# Patient Record
Sex: Female | Born: 1975
Health system: Southern US, Community
[De-identification: ages and names within clinical notes are randomized; demographics above are authoritative.]

## PROBLEM LIST (undated history)

## (undated) DIAGNOSIS — Z8489 Family history of other specified conditions: Secondary | ICD-10-CM

## (undated) DIAGNOSIS — R519 Headache, unspecified: Secondary | ICD-10-CM

## (undated) DIAGNOSIS — R091 Pleurisy: Secondary | ICD-10-CM

## (undated) DIAGNOSIS — F329 Major depressive disorder, single episode, unspecified: Secondary | ICD-10-CM

## (undated) DIAGNOSIS — R51 Headache: Secondary | ICD-10-CM

## (undated) DIAGNOSIS — F32A Depression, unspecified: Secondary | ICD-10-CM

## (undated) DIAGNOSIS — Z87442 Personal history of urinary calculi: Secondary | ICD-10-CM

## (undated) HISTORY — DX: Major depressive disorder, single episode, unspecified: F32.9

## (undated) HISTORY — DX: Depression, unspecified: F32.A

---
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
## 1998-11-30 DIAGNOSIS — Z87442 Personal history of urinary calculi: Secondary | ICD-10-CM

## 1998-11-30 HISTORY — DX: Personal history of urinary calculi: Z87.442

## 2005-08-07 ENCOUNTER — Observation Stay: Payer: Self-pay

## 2005-08-11 ENCOUNTER — Inpatient Hospital Stay: Payer: Self-pay

## 2008-01-01 HISTORY — PX: TUMOR REMOVAL: SHX12

## 2009-10-05 LAB — HM PAP SMEAR: HM Pap smear: NORMAL

## 2010-05-27 ENCOUNTER — Emergency Department (HOSPITAL_COMMUNITY): Admission: EM | Admit: 2010-05-27 | Discharge: 2010-05-27 | Payer: Self-pay | Admitting: Family Medicine

## 2010-05-27 ENCOUNTER — Emergency Department (HOSPITAL_COMMUNITY): Admission: EM | Admit: 2010-05-27 | Discharge: 2010-05-27 | Payer: Self-pay | Admitting: Emergency Medicine

## 2010-06-03 ENCOUNTER — Ambulatory Visit: Payer: Self-pay | Admitting: Family Medicine

## 2010-06-03 DIAGNOSIS — N926 Irregular menstruation, unspecified: Secondary | ICD-10-CM | POA: Insufficient documentation

## 2010-06-03 DIAGNOSIS — K219 Gastro-esophageal reflux disease without esophagitis: Secondary | ICD-10-CM | POA: Insufficient documentation

## 2010-06-03 DIAGNOSIS — D649 Anemia, unspecified: Secondary | ICD-10-CM | POA: Insufficient documentation

## 2010-06-03 DIAGNOSIS — N939 Abnormal uterine and vaginal bleeding, unspecified: Secondary | ICD-10-CM

## 2010-06-03 DIAGNOSIS — N760 Acute vaginitis: Secondary | ICD-10-CM | POA: Insufficient documentation

## 2010-06-03 LAB — CONVERTED CEMR LAB: Whiff Test: NEGATIVE

## 2010-06-04 ENCOUNTER — Telehealth: Payer: Self-pay | Admitting: Family Medicine

## 2010-06-09 ENCOUNTER — Ambulatory Visit (HOSPITAL_COMMUNITY): Admission: RE | Admit: 2010-06-09 | Discharge: 2010-06-09 | Payer: Self-pay | Admitting: Family Medicine

## 2010-07-16 ENCOUNTER — Telehealth: Payer: Self-pay | Admitting: Family Medicine

## 2010-08-11 ENCOUNTER — Telehealth (INDEPENDENT_AMBULATORY_CARE_PROVIDER_SITE_OTHER): Payer: Self-pay | Admitting: *Deleted

## 2010-08-11 ENCOUNTER — Ambulatory Visit: Payer: Self-pay | Admitting: Family Medicine

## 2010-08-11 DIAGNOSIS — M545 Low back pain, unspecified: Secondary | ICD-10-CM | POA: Insufficient documentation

## 2010-08-11 LAB — CONVERTED CEMR LAB
Bilirubin Urine: NEGATIVE
Glucose, Urine, Semiquant: NEGATIVE
Ketones, urine, test strip: NEGATIVE
Nitrite: NEGATIVE
Protein, U semiquant: NEGATIVE
Specific Gravity, Urine: 1.015
Urobilinogen, UA: 0.2
WBC Urine, dipstick: NEGATIVE

## 2010-08-12 ENCOUNTER — Encounter (INDEPENDENT_AMBULATORY_CARE_PROVIDER_SITE_OTHER): Payer: Self-pay | Admitting: *Deleted

## 2010-08-12 ENCOUNTER — Telehealth (INDEPENDENT_AMBULATORY_CARE_PROVIDER_SITE_OTHER): Payer: Self-pay | Admitting: *Deleted

## 2010-08-12 ENCOUNTER — Encounter: Payer: Self-pay | Admitting: Family Medicine

## 2010-10-29 ENCOUNTER — Ambulatory Visit: Payer: Self-pay | Admitting: Family Medicine

## 2010-10-29 DIAGNOSIS — R5381 Other malaise: Secondary | ICD-10-CM | POA: Insufficient documentation

## 2010-10-29 DIAGNOSIS — R5383 Other fatigue: Secondary | ICD-10-CM

## 2010-10-29 DIAGNOSIS — R635 Abnormal weight gain: Secondary | ICD-10-CM | POA: Insufficient documentation

## 2010-10-29 DIAGNOSIS — M79609 Pain in unspecified limb: Secondary | ICD-10-CM | POA: Insufficient documentation

## 2010-10-30 ENCOUNTER — Telehealth (INDEPENDENT_AMBULATORY_CARE_PROVIDER_SITE_OTHER): Payer: Self-pay | Admitting: *Deleted

## 2010-10-30 LAB — CONVERTED CEMR LAB
Basophils Absolute: 0 10*3/uL (ref 0.0–0.1)
Basophils Relative: 1 % (ref 0.0–3.0)
Eosinophils Absolute: 0 10*3/uL (ref 0.0–0.7)
Eosinophils Relative: 1 % (ref 0.0–5.0)
Hemoglobin: 12.3 g/dL (ref 12.0–15.0)
Lymphs Abs: 1.6 10*3/uL (ref 0.7–4.0)
MCV: 92.4 fL (ref 78.0–100.0)
Monocytes Absolute: 0.3 10*3/uL (ref 0.1–1.0)
Monocytes Relative: 11 % (ref 3.0–12.0)
Neutro Abs: 0.6 10*3/uL — ABNORMAL LOW (ref 1.4–7.7)
RBC: 3.84 M/uL — ABNORMAL LOW (ref 3.87–5.11)
TSH: 1.08 microintl units/mL (ref 0.35–5.50)
WBC: 2.5 10*3/uL — ABNORMAL LOW (ref 4.5–10.5)

## 2010-11-06 ENCOUNTER — Encounter: Payer: Self-pay | Admitting: Family Medicine

## 2010-11-11 ENCOUNTER — Ambulatory Visit: Payer: Self-pay | Admitting: Family Medicine

## 2010-11-11 DIAGNOSIS — J020 Streptococcal pharyngitis: Secondary | ICD-10-CM | POA: Insufficient documentation

## 2010-11-11 LAB — CONVERTED CEMR LAB: Rapid Strep: POSITIVE

## 2010-12-31 NOTE — Assessment & Plan Note (Signed)
Summary: pulled back//lch   Vital Signs:  Patient profile:   35 year old female Weight:      179 pounds Temp:     98.1 degrees F oral Pulse rate:   64 / minute Pulse rhythm:   regular BP sitting:   120 / 68  (left arm)  Vitals Entered By: Almeta Monas CMA [facility]) (August 11, 2010 8:18 AM) CC: c/o LBP, Back Pain   History of Present Illness:       This is a 35 year old woman who presents with Back Pain.  The symptoms began 1 week ago.  The patient denies fever, chills, weakness, loss of sensation, fecal incontinence, urinary incontinence, urinary retention, dysuria, rest pain, inability to work, and inability to care for self.  The pain is located in the left low back.  The pain began at home, gradually, after straining, and after overuse.  The pain is made worse by flexion and activity.  The pain is made better by inactivity.  No urinary symptoms.  Current Medications (verified): 1)  Aleve 220 Mg Tabs (Naproxen Sodium) .... As Needed 2)  Flexeril 10 Mg Tabs (Cyclobenzaprine Hcl) .Marland Kitchen.. 1 By Mouth Three Times A Day As Needed 3)  Vicodin Es 7.5-750 Mg Tabs (Hydrocodone-Acetaminophen) .Marland Kitchen.. 1 By Mouth Q6h As Needed  Allergies (verified): 1)  ! Pcn  Past History:  Past medical, surgical, family and social histories (including risk factors) reviewed for relevance to current acute and chronic problems.  Family History: Reviewed history and no changes required.  Social History: Reviewed history and no changes required.  Review of Systems      See HPI  Physical Exam  General:  Well-developed,well-nourished,in no acute distress; alert,appropriate and cooperative throughout examination Msk:  normal ROM, no joint tenderness, and no joint warmth.   Extremities:  No clubbing, cyanosis, edema, or deformity noted with normal full range of motion of all joints.   Neurologic:  alert & oriented X3, cranial nerves II-XII intact, strength normal in all extremities, gait normal, and DTRs  symmetrical and normal.   Psych:  Cognition and judgment appear intact. Alert and cooperative with normal attention span and concentration. No apparent delusions, illusions, hallucinations   Impression & Recommendations:  Problem # 1:  BACK PAIN, LUMBAR (ICD-724.2)  Her updated medication list for this problem includes:    Aleve 220 Mg Tabs (Naproxen sodium) .Marland Kitchen... As needed    Flexeril 10 Mg Tabs (Cyclobenzaprine hcl) .Marland Kitchen... 1 by mouth three times a day as needed    Vicodin Es 7.5-750 Mg Tabs (Hydrocodone-acetaminophen) .Marland Kitchen... 1 by mouth q6h as needed  Discussed use of moist heat or ice, modified activities, medications, and stretching/strengthening exercises. Back care instructions given. To be seen in 2 weeks if no improvement; sooner if worsening of symptoms.   Complete Medication List: 1)  Aleve 220 Mg Tabs (Naproxen sodium) .... As needed 2)  Flexeril 10 Mg Tabs (Cyclobenzaprine hcl) .Marland Kitchen.. 1 by mouth three times a day as needed 3)  Vicodin Es 7.5-750 Mg Tabs (Hydrocodone-acetaminophen) .Marland Kitchen.. 1 by mouth q6h as needed Prescriptions: VICODIN ES 7.5-750 MG TABS (HYDROCODONE-ACETAMINOPHEN) 1 by mouth q6h as needed  #30 x 0   Entered and Authorized by:   Loreen Freud DO   Signed by:   Loreen Freud DO on 08/11/2010   Method used:   Print then Give to Patient   RxID:   [id number] FLEXERIL 10 MG TABS (CYCLOBENZAPRINE HCL) 1 by mouth three times a day as needed  #  30 x 0   Entered and Authorized by:   Loreen Freud DO   Signed by:   Loreen Freud DO on 08/11/2010   Method used:   Electronically to        [facility]* (retail)       [address]       [address] [place] [address]mailing       [place], [place]  [address]       Ph: [phone]       Fax: [phone]   RxID:   [id number]   Laboratory Results   Urine Tests    Routine Urinalysis   Color: yellow Appearance: Clear Glucose: negative   (Normal Range: Negative) Bilirubin: negative   (Normal Range:  Negative) Ketone: negative   (Normal Range: Negative) Spec. Gravity: 1.015   (Normal Range: 1.003-1.035) Blood: negative   (Normal Range: Negative) pH: 5.0   (Normal Range: 5.0-8.0) Protein: negative   (Normal Range: Negative) Urobilinogen: 0.2   (Normal Range: 0-1) Nitrite: negative   (Normal Range: Negative) Leukocyte Esterace: negative   (Normal Range: Negative)

## 2010-12-31 NOTE — Assessment & Plan Note (Signed)
Summary: new to est//kn   Vital Signs:  Patient profile:   35 year old female Height:      66 inches Weight:      171 pounds BMI:     27.70 Pulse rate:   54 / minute BP sitting:   112 / 76  (left arm)  Vitals Entered By: Doristine Devoid (June 03, 2010 10:43 AM) CC: new est- noticed for past few months some irregular bleeding and f/u on gerd    History of Present Illness: 35 yo woman to establish care.  previous MD- [facility].  1) GERD- pt went to UC for abd pain, UC sent her to ER for gallbladder concerns.  ER dx'd her w/ GERD and started her on Zofran and protonix.  has GI f/u tomorrow.  sxs much improved since starting meds.  2) vaginal bleeding- mirena x4 years, some hx of irregular bleeding previously but over last year has had increased spotting- almost daily. feels bleeding has been stress related- is going through a divorce, grandmother got sick and died last week.  no pain.  small amounts of blood, typically brown in color.  denies odor, itching, burning.  husband cheated- pt reports she has been tested for STDs.  3) Depression- pt has difficulty talking about husband w/out crying.  currently divorcing.  grandmother died last week.  has not been in counseling.  'i start shaking when he calls me'.  admits to a lot of 'stress'.  + insomnia, tearfullness.  emotional eating.  denies SI/HI  Preventive Screening-Counseling & Management  Alcohol-Tobacco     Smoking Status: never  Caffeine-Diet-Exercise     Does Patient Exercise: yes     Type of exercise: goes to the [employer]     Times/week: 6      Sexual History:  currently divorcing.        Drug Use:  never.    Current Medications (verified): 1)  Mirena 20 Mcg/24hr Iud (Levonorgestrel) .... As Directed 2)  Pantoprazole Sodium 40 Mg Tbec (Pantoprazole Sodium) .... Take One Tablet Daily 3)  Ondansetron Hcl 4 Mg Tabs (Ondansetron Hcl) .... Take One Tablet Two Times A Day  Allergies (verified): 1)  ! Penicillin  Past  History:  Past Medical History: Anemia-NOS GERD hx of chicken pox  Headaches   Past Surgical History: lipoma removed from back  Past History:  Care Management: Gastroenterology:Dr. Elnoria Howard  Family History: CAD-maternal grandfather HTN-mother DM-maternal grandmother STROKE-maternal grandfather COLON CA-no BREAST CA-paternal grandmother BRAIN TUMOR-mother LIVER CA-maternal grandmotheR Family History of Alcoholism/Addiction-father deceased liver problems  Social History: divorced 3 children- 02, 05, 16.  son is youngest, 2 daughters. 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 Status:  never Does Patient Exercise:  yes Sexual History:  currently divorcing Drug Use:  never  Review of Systems      See HPI  Physical Exam  General:  Well-developed,well-nourished,in no acute distress; alert,appropriate and cooperative throughout examination Abdomen:  soft, NT/ND, +BS Genitalia:  Pelvic Exam:        External: normal female genitalia without lesions or masses        Vagina: normal without lesions or masses, + brown discharge        Cervix: normal without lesions or masses        Adnexa: normal bimanual exam without masses or fullness        Uterus: normal by palpation        Pap smear: not performed   Impression & Recommendations:  Problem #  1:  GERD (ICD-530.81) Assessment New has appt w/ GI tomorrow.  continue meds until otherwise directed by Dr Elnoria Howard. Her updated medication list for this problem includes:    Pantoprazole Sodium 40 Mg Tbec (Pantoprazole sodium) .Marland Kitchen... Take one tablet daily  Problem # 2:  ABNORMAL VAGINAL BLEEDING (ICD-626.9) Assessment: New  pt's nearly daily spotting for the last year may be stress related (due to hormonal fluctuations) but needs additional w/u.  will get pelvic US to r/u fibroids, endometrial abnormalities.  Pt expresses understanding and is in agreement w/ this plan. Her updated medication list for this problem includes:    Mirena  20 Mcg/24hr Iud (Levonorgestrel) .Marland Kitchen... As directed  Orders: Radiology Referral (Radiology)  Problem # 3:  VAGINITIS (ICD-616.10) Assessment: New  + yeast on wet prep.  start diflucan.  Orders: Wet Prep ([id number])  Complete Medication List: 1)  Mirena 20 Mcg/24hr Iud (Levonorgestrel) .... As directed 2)  Pantoprazole Sodium 40 Mg Tbec (Pantoprazole sodium) .... Take one tablet daily 3)  Ondansetron Hcl 4 Mg Tabs (Ondansetron hcl) .... Take one tablet two times a day 4)  Acyclovir Sodium 1000 Mg Solr (Acyclovir sodium) .... As needed 5)  Diflucan 150 Mg Tabs (Fluconazole) .... Once daily.  may repeat in 3 days if sxs persist  Patient Instructions: 1)  Follow up in 4-6 weeks to discuss the depression/anxiety 2)  Please call and start counseling- this will help a lot! 3)  Think about meds and let me know 4)  Take the Diflucan as directed 5)  Someone will call you with your pelvic ultrasound 6)  Please have Dr Elnoria Howard send me your GI records 7)  Call with any questions or concerns 8)  Hang in there!! Prescriptions: DIFLUCAN 150 MG TABS (FLUCONAZOLE) once daily.  may repeat in 3 days if sxs persist  #2 x 0   Entered and Authorized by:   Neena Rhymes MD   Signed by:   Neena Rhymes MD on 06/03/2010   Method used:   Electronically to        [facility]* (retail)       [address]       [address] Shipping/mailing       [place], [place]  [address]       Ph: [phone]       Fax: [phone]   RxID:   [id number]    Immunization History:  Tetanus/Td Immunization History:    Tetanus/Td:  historical (03/30/2010)   Laboratory Results    Wet Mount/KOH WBC/hpf 1-5 Bacteria/hpf 1+  Rods Clue cells/hpf none  Negative whiff Yeast/hpf few KOH Negative Trichomonas/hpf none

## 2010-12-31 NOTE — Progress Notes (Signed)
Summary: resend rx   Phone Note Refill Request Message from:  Fax from Pharmacy on August 11, 2010 1:15 PM  Refills Requested: Medication #1:  FLEXERIL 10 MG TABS 1 by mouth three times a day as needed [facility] didnt receive rx   Initial call taken by: Okey Regal Spring,  August 11, 2010 1:17 PM  Follow-up for Phone Call        [facility] pharmacy and per Shanda Bumps they rcv'd the rx.Marland KitchenMarland KitchenMarland KitchenCall ended Follow-up by: Almeta Monas CMA [facility]),  August 11, 2010 1:22 PM

## 2010-12-31 NOTE — Progress Notes (Signed)
Summary: clarify med  Phone Note Refill Request Message from:  Fax from Pharmacy on October 30, 2010 9:49 AM  Refills Requested: Medication #1:  ACYCLOVIR SODIUM 1000 MG SOLR as needed [facility] - note on fax "strength does not exist,do you mean generic Zovirax to be doubled or generic Valtrex 1 gran? Please clarify, thanks"   Initial call taken by: Okey Regal Spring,  October 30, 2010 9:56 AM  Follow-up for Phone Call        change to Valacyclovir 500mg  two times a day x3 days as needed for outbreaks.  #60, 3 refills. Follow-up by: Neena Rhymes MD,  October 30, 2010 2:44 PM    New/Updated Medications: VALTREX 500 MG TABS (VALACYCLOVIR HCL) take two times a day x3 days for prn outbreaks Prescriptions: VALTREX 500 MG TABS (VALACYCLOVIR HCL) take two times a day x3 days for prn outbreaks  #60 x 3   Entered by:   Doristine Devoid CMA   Authorized by:   Neena Rhymes MD   Signed by:   Doristine Devoid CMA on 10/30/2010   Method used:   Electronically to        [facility]* (retail)       [address]       [address] Shipping/mailing       [place], [place]  [address]       Ph: [phone]       Fax: [phone]   RxID:   [id number]   Appended Document: clarify med    Clinical Lists Changes  Medications: Changed medication from VALTREX 500 MG TABS (VALACYCLOVIR HCL) take two times a day x3 days for prn outbreaks to ACYCLOVIR SODIUM 500 MG SOLR (ACYCLOVIR SODIUM) take as needed - Signed Rx of ACYCLOVIR SODIUM 500 MG SOLR (ACYCLOVIR SODIUM) take as needed;  #60 x 3;  Signed;  Entered by: Doristine Devoid CMA;  Authorized by: Neena Rhymes MD;  Method used: Electronically to [facility]*, [address], [address] Shipping/mailing, [place], [place]  [address], Ph: [phone], Fax: [phone]    Prescriptions: ACYCLOVIR SODIUM 500 MG SOLR (ACYCLOVIR SODIUM) take as needed  #60 x 3   Entered by:   Doristine Devoid CMA  Authorized by:   Neena Rhymes MD   Signed by:   Doristine Devoid CMA on 10/30/2010   Method used:   Electronically to        [facility]* (retail)       [address]       [address] Shipping/mailing       [place], [place]  [address]       Ph: [phone]       Fax: [phone]   RxID:   [id number]    Appended Document: clarify med Prescriptions: ACYCLOVIR 400 MG TABS (ACYCLOVIR) Take 1 tab three times a day for 5 day as needed  #90 x 3   Entered by:   Jeremy Johann CMA   Authorized by:   Neena Rhymes MD   Signed by:   Jeremy Johann CMA on 10/30/2010   Method used:   Re-Faxed to ...       [facility]* (retail)       [address]       [address] Shipping/mailing       [place], [place]  [address]       Ph: [phone]  Fax: [phone]   RxID:   [id number]

## 2010-12-31 NOTE — Progress Notes (Signed)
Summary: needs work note  Phone Note Call from Patient Call back at [employer] [phone]   Caller: Patient Summary of Call: spoke w/ patient says she needs to get note for missing work today due to medication says she discuss that she made need one w/ Dr. Laury Axon due to medication being strong to her. Initial call taken by: Doristine Devoid CMA,  August 12, 2010 2:15 PM  Follow-up for Phone Call        ok to give note for today Follow-up by: Loreen Freud DO,  August 12, 2010 2:54 PM  Additional Follow-up for Phone Call Additional follow up Details #1::        done .....Marland KitchenMarland KitchenDoristine Devoid CMA  August 12, 2010 2:57 PM

## 2010-12-31 NOTE — Letter (Signed)
Summary: Out of Work  [employer] [facility] [employer]  [address] [place], [place] [address]   Phone: [phone]  Fax: [phone]    August 12, 2010   Employee:  BONNY VANLEEUWEN    To Whom It May Concern:   For Medical reasons, please excuse the above named employee from work for the following dates:  Start:   08/12/2010  End:   08/12/2010  If you need additional information, please feel free to contact our office.         Sincerely,    Loreen Freud DO

## 2010-12-31 NOTE — Progress Notes (Signed)
Summary: FYI and Anxiety Med  Phone Note Call from Patient Call back at Home Phone [phone]   Caller: Patient Reason for Call: Talk to Doctor Summary of Call: Patient saw Dr. Elnoria Howard today and he have her Nexium twice a day. He stated the same thing about her nevers as you did and agress that she needs to put on medication for this. Please advise.  Initial call taken by: Harold Barban,  June 04, 2010 2:52 PM  Follow-up for Phone Call        pt needs SSRI as maintainence med.  will start Celexa daily w/ xanax as needed for the next month as a bridge while med gets in her system. Follow-up by: Neena Rhymes MD,  June 04, 2010 3:04 PM  Additional Follow-up for Phone Call Additional follow up Details #1::        [email or web address] Barban  June 04, 2010 3:12 PM    Additional Follow-up for Phone Call Additional follow up Details #2::    Patient is aware of medication information and will come and pick up rx's. Follow-up by: Harold Barban,  June 04, 2010 3:48 PM  New/Updated Medications: CITALOPRAM HYDROBROMIDE 20 MG TABS (CITALOPRAM HYDROBROMIDE) take one tablet by mouth daily ALPRAZOLAM 0.5 MG  TABS (ALPRAZOLAM) 1 tab by mouth Q6 as needed for anxiety Prescriptions: ALPRAZOLAM 0.5 MG  TABS (ALPRAZOLAM) 1 tab by mouth Q6 as needed for anxiety  #30 x 0   Entered and Authorized by:   Neena Rhymes MD   Signed by:   Neena Rhymes MD on 06/04/2010   Method used:   Printed then faxed to ...         RxID:   [id number] CITALOPRAM HYDROBROMIDE 20 MG TABS (CITALOPRAM HYDROBROMIDE) take one tablet by mouth daily  #30 x 3   Entered and Authorized by:   Neena Rhymes MD   Signed by:   Neena Rhymes MD on 06/04/2010   Method used:   Printed then faxed to ...         RxID:   [id number]

## 2010-12-31 NOTE — Letter (Signed)
Summary: Out of Work  [facility] [facility] [employer]  [address] [place], [place] [address]   Phone: [phone]  Fax: [phone]    August 12, 2010   Employee:  TIARA MAULTSBY    To Whom It May Concern:   For Medical reasons, please excuse the above named employee from work for the following dates:  Start:   08/12/10    End:   08/12/10  If you need additional information, please feel free to contact our office.         Sincerely,    Loreen Freud, DO

## 2010-12-31 NOTE — Progress Notes (Signed)
Summary: Poison Oak  Phone Note Call from Patient Call back at [employer] [phone]   Caller: Patient Reason for Call: Acute Illness Summary of Call: Patient called in and said she has poison oak. She said at her old doctor she could call in and they would call her something in. I told her we could do a steriod cream because we don't do oral steriods without and OV. She said that would be great and if the cream didnt seem to help she would come in for an OV.   Pharmacy: [facility]  Allergy: Penicillin Initial call taken by: Harold Barban,  July 16, 2010 9:54 AM  Follow-up for Phone Call        Patient is aware.  Follow-up by: Harold Barban,  July 16, 2010 10:17 AM    New/Updated Medications: TRIAMCINOLONE ACETONIDE 0.1 % OINT (TRIAMCINOLONE ACETONIDE) apply to affected area two times a day.  disp 1 large tube Prescriptions: TRIAMCINOLONE ACETONIDE 0.1 % OINT (TRIAMCINOLONE ACETONIDE) apply to affected area two times a day.  disp 1 large tube  #1 x 3   Entered and Authorized by:   Neena Rhymes MD   Signed by:   Neena Rhymes MD on 07/16/2010   Method used:   Electronically to        [facility]* (retail)       [address]       [address] Shipping/mailing       [place], [place]  [address]       Ph: [phone]       Fax: [phone]   RxID:   [id number]

## 2011-01-01 NOTE — Assessment & Plan Note (Signed)
Summary: sore throat, white patches,CBCD-288.00/fd   Vital Signs:  Patient profile:   35 year old female Weight:      183.4 pounds BMI:     29.71 Temp:     97.5 degrees F oral BP sitting:   110 / 60  (right arm) Cuff size:   regular  Vitals Entered By: Almeta Monas CMA Duncan Dull) (November 11, 2010 8:10 AM) CC: c/o sore throat x 4 days, URI symptoms   History of Present Illness:       This is a 35 year old woman who presents with URI symptoms.  The symptoms began 4 days ago.  The patient complains of nasal congestion and sore throat, but denies clear nasal discharge, purulent nasal discharge, dry cough, productive cough, earache, and sick contacts.  The patient denies fever, low-grade fever (<100.5 degrees), fever of 100.5-103 degrees, fever of 103.1-104 degrees, fever to >104 degrees, stiff neck, dyspnea, wheezing, rash, vomiting, diarrhea, use of an antipyretic, and response to antipyretic.  The patient denies itchy watery eyes, itchy throat, sneezing, seasonal symptoms, response to antihistamine, headache, muscle aches, and severe fatigue.  Risk factors for Strep sinusitis include Strep exposure.  The patient denies the following risk factors for Strep sinusitis: unilateral facial pain, unilateral nasal discharge, poor response to decongestant, double sickening, tooth pain, tender adenopathy, and absence of cough.    Current Medications (verified): 1)  Mirena 20 Mcg/24hr Iud (Levonorgestrel) .... As Directed 2)  Nexium 40 Mg Cpdr (Esomeprazole Magnesium) .... Take One Tablet Daily 3)  Ondansetron Hcl 4 Mg Tabs (Ondansetron Hcl) .... Take One Tablet Two Times A Day 4)  Acyclovir 400 Mg Tabs (Acyclovir) .... Take 1 Tab Three Times A Day For 5 Day As Needed 5)  Diflucan 150 Mg Tabs (Fluconazole) .... Once Daily.  May Repeat in 3 Days If Sxs Persist 6)  Triamcinolone Acetonide 0.1 % Oint (Triamcinolone Acetonide) .... Apply To Affected Area Two Times A Day.  Disp 1 Large Tube 7)  Aleve 220 Mg  Tabs (Naproxen Sodium) .... As Needed 8)  Flexeril 10 Mg Tabs (Cyclobenzaprine Hcl) .Marland Kitchen.. 1 By Mouth Three Times A Day As Needed 9)  Vicodin Es 7.5-750 Mg Tabs (Hydrocodone-Acetaminophen) .Marland Kitchen.. 1 By Mouth Q6h As Needed 10)  Zithromax Z-Pak 250 Mg Tabs (Azithromycin) .... As Directed 11)  Prednisone 10 Mg Tabs (Prednisone) .... 3 By Mouth Once Daily For 3 Days Then 2 By Mouth Once Daily For 3 Days Then 1 By Mouth Once Daily For 3 Days  Allergies (verified): 1)  ! Penicillin 2)  ! Pcn  Past History:  Past Medical History: Last updated: 06/03/2010 Anemia-NOS GERD hx of chicken pox  Headaches   Past Surgical History: Last updated: 06/03/2010 lipoma removed from back  Family History: Last updated: 10/29/2010 CAD-maternal grandfather HTN-mother DM-maternal grandmother STROKE-maternal grandfather COLON CA-no BREAST CA-paternal grandmother BRAIN TUMOR-mother LIVER CA-maternal grandmotheR Family History of Alcoholism/Addiction-father deceased liver problems  Social History: Last updated: 06/03/2010 divorced 3 children- 02, 05, 06.  son is youngest, 2 daughters. 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 CAD-maternal grandfather HTN-mother DM-maternal grandmother STROKE-maternal grandfather COLON CA-no BREAST CA-paternal grandmother BRAIN TUMOR-mother LIVER CA-maternal grandmotheR Family History of Alcoholism/Addiction-father deceased liver problems  Social History: Reviewed history from 06/03/2010 and no changes required. divorced 3 children- 02, 05, 06.  son is youngest, 2 daughters. 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  Otoscopic examination reveals clear canals, tympanic membranes are intact bilaterally without bulging, retraction, inflammation or discharge. Hearing is grossly normal bilaterally. Nose:  External nasal examination shows no deformity or inflammation. Nasal mucosa are pink and moist without lesions or exudates. Mouth:  no exudates and pharyngeal erythema.   Neck:  No deformities, masses, or tenderness noted. Lungs:  Normal respiratory effort, chest expands symmetrically. Lungs are clear to auscultation, no crackles or wheezes. Heart:  normal rate and no murmur.   Cervical Nodes:  No lymphadenopathy noted Psych:  Cognition and judgment appear intact. Alert and cooperative with normal attention span and concentration. No apparent delusions, illusions, hallucinations   Impression & Recommendations:  Problem # 1:  STREPTOCOCCAL SORE THROAT (ICD-034.0)  Her updated medication list for this problem includes:    Aleve 220 Mg Tabs (Naproxen sodium) .Marland Kitchen... As needed    Zithromax Z-pak 250 Mg Tabs (Azithromycin) .Marland Kitchen... As directed  Orders: Depo- Medrol 80mg  (J1040) Admin of Therapeutic Inj  intramuscular or subcutaneous (16109) Rapid Strep (60454)  Instructed to complete antibiotics and call if not improved in 48 hours.   Complete Medication List: 1)  Mirena 20 Mcg/24hr Iud (Levonorgestrel) .... As directed 2)  Nexium 40 Mg Cpdr (Esomeprazole magnesium) .... Take one tablet daily 3)  Ondansetron Hcl 4 Mg Tabs (Ondansetron hcl) .... Take one tablet two times a day 4)  Acyclovir 400 Mg Tabs (Acyclovir) .... Take 1 tab three times a day for 5 day as needed 5)  Diflucan 150 Mg Tabs (Fluconazole) .... Once daily.  may repeat in 3 days if sxs persist 6)  Triamcinolone Acetonide 0.1 % Oint (Triamcinolone acetonide) .... Apply to affected area two times a day.  disp 1 large tube 7)  Aleve 220 Mg Tabs (Naproxen sodium) .... As needed 8)  Flexeril 10 Mg  Tabs (Cyclobenzaprine hcl) .Marland Kitchen.. 1 by mouth three times a day as needed 9)  Vicodin Es 7.5-750 Mg Tabs (Hydrocodone-acetaminophen) .Marland Kitchen.. 1 by mouth q6h as needed 10)  Zithromax Z-pak 250 Mg Tabs (Azithromycin) .... As directed 11)  Prednisone 10 Mg Tabs (Prednisone) .... 3 by mouth once daily for 3 days then 2 by mouth once daily for 3 days then 1 by mouth once daily for 3 days Prescriptions: PREDNISONE 10 MG TABS (PREDNISONE) 3 by mouth once daily for 3 days then 2 by mouth once daily for 3 days then 1 by mouth once daily for 3 days  #18 x 0   Entered and Authorized by:   Loreen Freud DO   Signed by:   Loreen Freud DO on 11/11/2010   Method used:   Print then Give to Patient   RxID:   [id number] ZITHROMAX Z-PAK 250 MG TABS (AZITHROMYCIN) as directed  #1 x 0   Entered and Authorized by:   Loreen Freud DO   Signed by:   Loreen Freud DO on 11/11/2010   Method used:   Electronically to        [facility]* (retail)       [address]       [address] Shipping/mailing       [place], [place]  [address]       Ph: [phone]       Fax: [phone]  RxID:   [id number]    Medication Administration  Injection # 1:    Medication: Depo- Medrol 80mg     Diagnosis: STREPTOCOCCAL SORE THROAT (ICD-034.0)    Route: IM    Site: RUOQ gluteus    Exp Date: 05/31/2011    Lot #: [id number]    Mfr: Pharmacia    Patient tolerated injection without complications    Given by: Almeta Monas CMA Duncan Dull) (November 11, 2010 8:51 AM)  Orders Added: 1)  Depo- Medrol 80mg  [J1040] 2)  Admin of Therapeutic Inj  intramuscular or subcutaneous [96372] 3)  Est. Patient Level III [14782] 4)  Rapid Strep [95621]    Laboratory Results    Other Tests  Rapid Strep: positive

## 2011-01-01 NOTE — Letter (Signed)
Summary: Out of Work  [facility] [employer] [facility] [employer]  [address] [place], [place] [address]   Phone: [phone]  Fax: [phone]    November 11, 2010   Employee:  KERRIN MARKMAN    To Whom It May Concern:   For Medical reasons, please excuse the above named employee from work for the following dates:  Start:   11/11/2010  End:   11/12/2010  If you need additional information, please feel free to contact our office.         Sincerely,    Loreen Freud DO

## 2011-01-01 NOTE — Assessment & Plan Note (Signed)
Summary: RTO /CBS   Vital Signs:  Patient profile:   35 year old female Height:      66 inches Weight:      181 pounds BMI:     29.32 Pulse rate:   72 / minute BP sitting:   116 / 80  (left arm)  Vitals Entered By: Doristine Devoid CMA (October 29, 2010 2:39 PM) CC: discuss mirena and check L foot    History of Present Illness: 35 yo woman here today for  1) Mirena- due to be removed June.  still having daily bleeding.  GYNLogan Bores.  pt reports any sort of emotional upset and she'll bleed.  hasn't bled this week but this is first week in 'months'.  requires pads daily.  2) weight gain- has gained 36 lbs in the last 9 months.  going to the gym regularly.  was previously 150.  eating oatmeal for breakfast, tilapia and brown rice for lunch and dinner.  no sugared beverages.  recent lab work for insurance was normal per pt report.  no documented thyroid level on record.  3) L foot pain- pain at 1st MTP joint.  noteable bunion.  unable to fit in shoes due to enlarged joint and swelling.  4) GERD- needs refill of nexium  5) fatigue- 'i'm just exhausted'.  reports getting enough sleep but never feeling rested.  has had a very stressful year (divorce)  Preventive Screening-Counseling & Management  Caffeine-Diet-Exercise     Does Patient Exercise: yes  Current Medications (verified): 1)  Mirena 20 Mcg/24hr Iud (Levonorgestrel) .... As Directed 2)  Nexium 40 Mg Cpdr (Esomeprazole Magnesium) .... Take One Tablet Daily 3)  Ondansetron Hcl 4 Mg Tabs (Ondansetron Hcl) .... Take One Tablet Two Times A Day 4)  Acyclovir Sodium 1000 Mg Solr (Acyclovir Sodium) .... As Needed 5)  Diflucan 150 Mg Tabs (Fluconazole) .... Once Daily.  May Repeat in 3 Days If Sxs Persist 6)  Triamcinolone Acetonide 0.1 % Oint (Triamcinolone Acetonide) .... Apply To Affected Area Two Times A Day.  Disp 1 Large Tube 7)  Aleve 220 Mg Tabs (Naproxen Sodium) .... As Needed 8)  Flexeril 10 Mg Tabs (Cyclobenzaprine Hcl)  .Marland Kitchen.. 1 By Mouth Three Times A Day As Needed 9)  Vicodin Es 7.5-750 Mg Tabs (Hydrocodone-Acetaminophen) .Marland Kitchen.. 1 By Mouth Q6h As Needed  Allergies (verified): 1)  ! Penicillin 2)  ! Pcn  Past History:  Past medical, surgical, family and social histories (including risk factors) reviewed, and no changes noted (except as noted below).  Past Medical History: Reviewed history from 06/03/2010 and no changes required. Anemia-NOS GERD hx of chicken pox  Headaches   Past Surgical History: Reviewed history from 06/03/2010 and no changes required. lipoma removed from back  Family History: Reviewed history from 06/03/2010 and no changes required. CAD-maternal grandfather HTN-mother DM-maternal grandmother STROKE-maternal grandfather COLON CA-no BREAST CA-paternal grandmother BRAIN TUMOR-mother LIVER CA-maternal grandmotheR Family History of Alcoholism/Addiction-father deceased liver problems  Social History: Reviewed history from 06/03/2010 and no changes required. divorced 3 children- 02, 05, 06.  son is youngest, 2 daughters. 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 No apparent alopecia or balding. Neck:  No deformities, masses, or tenderness noted. Lungs:  Normal respiratory effort, chest expands symmetrically. Lungs are clear to auscultation, no crackles or wheezes. Heart:  Normal rate and regular rhythm. S1 and S2 normal without gallop, murmur, click, rub or other extra sounds. Abdomen:  Bowel sounds positive,abdomen soft and non-tender without masses, organomegaly or hernias noted. Pulses:  +2 carotid, radial, DP Extremities:  no C/C/E large bunion on L foot at MTP joint Cervical Nodes:  No lymphadenopathy noted Psych:  Cognition and judgment  appear intact. Alert and cooperative with normal attention span and concentration. No apparent delusions, illusions, hallucinations   Impression & Recommendations:  Problem # 1:  ABNORMAL VAGINAL BLEEDING (ICD-626.9) Assessment Unchanged refer to GYN for complete evaluation and tx. Her updated medication list for this problem includes:    Mirena 20 Mcg/24hr Iud (Levonorgestrel) .Marland Kitchen... As directed  Orders: Gynecologic Referral (Gyn)  Problem # 2:  WEIGHT GAIN (ICD-783.1) Assessment: New pt reports healthy diet and regular exercise.  check TSH.  encouraged continued exercise, healthy food choices.  may be related to pt's emotional stress.  will follow Orders: Specimen Handling (04540) TLB-TSH (Thyroid Stimulating Hormone) (84443-TSH)  Problem # 3:  FATIGUE (ICD-780.79) Assessment: New need to r/o anemia and hypothyroid.  may also be due to emotional strain.  will follow. Orders: Venipuncture (98119) Specimen Handling (14782) TLB-CBC Platelet - w/Differential (85025-CBCD) TLB-TSH (Thyroid Stimulating Hormone) (84443-TSH)  Problem # 4:  FOOT PAIN, LEFT (ICD-729.5) Assessment: New large bunion of L foot.  refer to podiatry for discussion of tx. Orders: Podiatry Referral (Podiatry)  Complete Medication List: 1)  Mirena 20 Mcg/24hr Iud (Levonorgestrel) .... As directed 2)  Nexium 40 Mg Cpdr (Esomeprazole magnesium) .... Take one tablet daily 3)  Ondansetron Hcl 4 Mg Tabs (Ondansetron hcl) .... Take one tablet two times a day 4)  Acyclovir 400 Mg Tabs (Acyclovir) .... Take 1 tab three times a day for 5 day as needed 5)  Diflucan 150 Mg Tabs (Fluconazole) .... Once daily.  may repeat in 3 days if sxs persist 6)  Triamcinolone Acetonide 0.1 % Oint (Triamcinolone acetonide) .... Apply to affected area two times a day.  disp 1 large tube 7)  Aleve 220 Mg Tabs (Naproxen sodium) .... As needed 8)  Flexeril 10 Mg Tabs (Cyclobenzaprine hcl) .Marland Kitchen.. 1 by mouth three times a day as needed 9)   Vicodin Es 7.5-750 Mg Tabs (Hydrocodone-acetaminophen) .Marland Kitchen.. 1 by mouth q6h as needed  Patient Instructions: 1)  We'll call you with your podiatry and GYN appts 2)  We'll notify you of your lab results 3)  Continue to make healthy food choices and get regular exercise 4)  Continue to wear supportive, comfortable shoes 5)  Take Aleve for the foot pain 6)  Call with any questions or concerns 7)  Hang in there!!! Prescriptions: NEXIUM 40 MG CPDR (ESOMEPRAZOLE MAGNESIUM) take one tablet daily  #90 x 3   Entered and Authorized by:   Neena Rhymes MD   Signed by:   Neena Rhymes MD on 10/29/2010   Method used:   Electronically to        [facility]* (retail)       [address]       [address] Shipping/mailing       [place], [place]  [address]       Ph: [phone]       Fax: [phone]   RxID:   [id number] ACYCLOVIR SODIUM 1000 MG SOLR (ACYCLOVIR SODIUM) as needed  #60 x 6   Entered and Authorized by:   Neena Rhymes MD   Signed by:   Neena Rhymes  MD on 10/29/2010   Method used:   Electronically to        [facility]* (retail)       [address]       [address] Shipping/mailing       [place], [place]  [address]       Ph: [phone]       Fax: [phone]   RxID:   [id number]    Orders Added: 1)  Venipuncture [83151] 2)  Specimen Handling [99000] 3)  Gynecologic Referral [Gyn] 4)  Podiatry Referral [Podiatry] 5)  TLB-CBC Platelet - w/Differential [85025-CBCD] 6)  TLB-TSH (Thyroid Stimulating Hormone) [84443-TSH] 7)  Est. Patient Level IV [76160]

## 2011-01-01 NOTE — Consult Note (Signed)
Summary: [facility]   Imported By: Lanelle Bal 11/27/2010 09:33:38  _____________________________________________________________________  External Attachment:    Type:   Image     Comment:   External Document

## 2011-01-01 NOTE — Consult Note (Signed)
Summary: [facility]   Imported By: Lanelle Bal 11/25/2010 09:17:40  _____________________________________________________________________  External Attachment:    Type:   Image     Comment:   External Document

## 2011-02-15 LAB — CBC
HCT: 36.1 % (ref 36.0–46.0)
Hemoglobin: 12.5 g/dL (ref 12.0–15.0)
Platelets: 160 10*3/uL (ref 150–400)
RBC: 3.83 MIL/uL — ABNORMAL LOW (ref 3.87–5.11)

## 2011-02-15 LAB — POCT URINALYSIS DIP (DEVICE)
Bilirubin Urine: NEGATIVE
Glucose, UA: NEGATIVE mg/dL
Ketones, ur: NEGATIVE mg/dL

## 2011-02-15 LAB — DIFFERENTIAL
Eosinophils Absolute: 0 10*3/uL (ref 0.0–0.7)
Lymphs Abs: 1.3 10*3/uL (ref 0.7–4.0)
Neutro Abs: 1.6 10*3/uL — ABNORMAL LOW (ref 1.7–7.7)
Neutrophils Relative %: 50 % (ref 43–77)

## 2011-02-15 LAB — COMPREHENSIVE METABOLIC PANEL
Alkaline Phosphatase: 36 U/L — ABNORMAL LOW (ref 39–117)
BUN: 8 mg/dL (ref 6–23)
CO2: 27 mEq/L (ref 19–32)
Creatinine, Ser: 0.88 mg/dL (ref 0.4–1.2)
GFR calc Af Amer: >60 mL/min (ref >60)
Potassium: 4.4 mEq/L (ref 3.5–5.1)
Sodium: 139 mEq/L (ref 135–145)
Total Protein: 7.1 g/dL (ref 6.0–8.3)

## 2011-02-16 ENCOUNTER — Telehealth: Payer: Self-pay | Admitting: Family Medicine

## 2011-02-16 LAB — LIPASE, BLOOD: Lipase: 36 U/L (ref 11–59)

## 2011-02-26 NOTE — Progress Notes (Signed)
Summary: Wants to start Phentermine  Phone Note Call from Patient   Caller: Patient Call For: Neena Rhymes MD Summary of Call: spoke with patient and she was having some issues with weight gain and wants to know if we could  start her on Phentermine.Stated she is going to the gym 5 days a week and watching diet and she still is  not losing the weight. She stated she is actually gaining more weight and  says it is not muscle weight She has taken the phentermin in the past and it worked for her. Please advise if the patient will need and OV or if you want to fax the RX in.... c/b [phone] Initial call taken by: Almeta Monas CMA Duncan Dull),  February 16, 2011 3:52 PM  Follow-up for Phone Call        ok to start meds but needs appt in 2-3 weeks to recheck BP b/c this is a stimulant and can elevate BP and HR. Follow-up by: Neena Rhymes MD,  February 16, 2011 4:20 PM  Additional Follow-up for Phone Call Additional follow up Details #1::        Patient notified. Lucious Groves CMA  February 16, 2011 4:49 PM  Additional Follow-up by: Lucious Groves CMA,  February 16, 2011 4:49 PM    New/Updated Medications: PHENTERMINE HCL 37.5 MG CAPS (PHENTERMINE HCL) 1 tab daily Prescriptions: PHENTERMINE HCL 37.5 MG CAPS (PHENTERMINE HCL) 1 tab daily  #30 x 2   Entered and Authorized by:   Neena Rhymes MD   Signed by:   Neena Rhymes MD on 02/16/2011   Method used:   Print then Give to Patient   RxID:   [id number]

## 2011-03-26 ENCOUNTER — Inpatient Hospital Stay (INDEPENDENT_AMBULATORY_CARE_PROVIDER_SITE_OTHER)
Admission: RE | Admit: 2011-03-26 | Discharge: 2011-03-26 | Disposition: A | Discharge status: Home / Self Care | Payer: 59 | Source: Ambulatory Visit | Attending: Emergency Medicine | Admitting: Emergency Medicine

## 2011-03-26 DIAGNOSIS — J019 Acute sinusitis, unspecified: Secondary | ICD-10-CM

## 2011-05-04 ENCOUNTER — Inpatient Hospital Stay (INDEPENDENT_AMBULATORY_CARE_PROVIDER_SITE_OTHER)
Admission: RE | Admit: 2011-05-04 | Discharge: 2011-05-04 | Disposition: A | Discharge status: Home / Self Care | Payer: Self-pay | Source: Ambulatory Visit

## 2011-05-04 ENCOUNTER — Ambulatory Visit (INDEPENDENT_AMBULATORY_CARE_PROVIDER_SITE_OTHER): Payer: 59

## 2011-05-04 DIAGNOSIS — R6889 Other general symptoms and signs: Secondary | ICD-10-CM

## 2011-05-04 DIAGNOSIS — T6391XA Toxic effect of contact with unspecified venomous animal, accidental (unintentional), initial encounter: Secondary | ICD-10-CM

## 2011-05-04 LAB — DIFFERENTIAL
Basophils Absolute: 0 10*3/uL (ref 0.0–0.1)
Eosinophils Absolute: 0 10*3/uL (ref 0.0–0.7)
Lymphocytes Relative: 37 % (ref 12–46)
Monocytes Relative: 6 % (ref 3–12)

## 2011-05-04 LAB — CBC
HCT: 40.2 % (ref 36.0–46.0)
Hemoglobin: 13.9 g/dL (ref 12.0–15.0)
MCH: 31 pg (ref 26.0–34.0)
MCHC: 34.6 g/dL (ref 30.0–36.0)
MCV: 89.7 fL (ref 78.0–100.0)
RDW: 13.7 % (ref 11.5–15.5)
WBC: 3.8 10*3/uL — ABNORMAL LOW (ref 4.0–10.5)

## 2011-05-04 LAB — SEDIMENTATION RATE: Sed Rate: 4 mm/hr (ref 0–22)

## 2011-05-05 LAB — POCT URINALYSIS DIP (DEVICE)
Bilirubin Urine: NEGATIVE
Glucose, UA: NEGATIVE mg/dL
Ketones, ur: NEGATIVE mg/dL
Nitrite: NEGATIVE
Protein, ur: NEGATIVE mg/dL
Specific Gravity, Urine: <=1.005 (ref 1.005–1.030)
pH: 6 (ref 5.0–8.0)

## 2011-06-11 ENCOUNTER — Other Ambulatory Visit: Payer: Self-pay | Admitting: Family Medicine

## 2011-06-11 NOTE — Telephone Encounter (Signed)
Last refilled 02/16/11 and pt last saw you in November. Please advise.

## 2011-06-11 NOTE — Telephone Encounter (Signed)
Noted. Thanks.

## 2011-06-11 NOTE — Telephone Encounter (Signed)
No refills- this med was only to be a 12 week medication.

## 2011-07-20 ENCOUNTER — Encounter: Payer: Self-pay | Admitting: Family Medicine

## 2011-07-20 ENCOUNTER — Telehealth: Payer: Self-pay

## 2011-07-20 ENCOUNTER — Ambulatory Visit (INDEPENDENT_AMBULATORY_CARE_PROVIDER_SITE_OTHER): Payer: 59 | Admitting: Family Medicine

## 2011-07-20 DIAGNOSIS — M545 Low back pain, unspecified: Secondary | ICD-10-CM

## 2011-07-20 DIAGNOSIS — Z0000 Encounter for general adult medical examination without abnormal findings: Secondary | ICD-10-CM | POA: Insufficient documentation

## 2011-07-20 LAB — HEPATIC FUNCTION PANEL
AST: 24 U/L (ref 0–37)
Alkaline Phosphatase: 39 U/L (ref 39–117)
Bilirubin, Direct: 0.1 mg/dL (ref 0.0–0.3)
Total Bilirubin: 0.3 mg/dL (ref 0.3–1.2)

## 2011-07-20 LAB — BASIC METABOLIC PANEL
CO2: 24 mEq/L (ref 19–32)
Calcium: 9 mg/dL (ref 8.4–10.5)
GFR: 82.87 mL/min (ref >60.00)
Glucose, Bld: 89 mg/dL (ref 70–99)
Potassium: 4.4 mEq/L (ref 3.5–5.1)

## 2011-07-20 LAB — CBC WITH DIFFERENTIAL/PLATELET
HCT: 34.2 % — ABNORMAL LOW (ref 36.0–46.0)
Hemoglobin: 11.6 g/dL — ABNORMAL LOW (ref 12.0–15.0)
MCHC: 33.8 g/dL (ref 30.0–36.0)
MCV: 94.8 fl (ref 78.0–100.0)
Monocytes Relative: 6.7 % (ref 3.0–12.0)
Neutro Abs: 1.5 10*3/uL (ref 1.4–7.7)
Platelets: 174 10*3/uL (ref 150.0–400.0)
RDW: 13.7 % (ref 11.5–14.6)

## 2011-07-20 LAB — LIPID PANEL
Cholesterol: 141 mg/dL (ref 0–200)
LDL Cholesterol: 71 mg/dL (ref 0–99)

## 2011-07-20 MED ORDER — NAPROXEN 500 MG PO TABS: 500.0000 mg | ORAL_TABLET | Freq: Two times a day (BID) | ORAL | Status: DC

## 2011-07-20 NOTE — Assessment & Plan Note (Signed)
Pt's PE WNL.  UTD on health maintenance.  Check labs.  Anticipatory guidance provided.  

## 2011-07-20 NOTE — Assessment & Plan Note (Signed)
Pt's pain seems to be centered over her SI joints.  Start scheduled NSAIDs and heating pad.  If no improvement in 2 weeks pt to call and we will pursue additional w/u.  Pt expressed understanding and is in agreement w/ plan.

## 2011-07-20 NOTE — Progress Notes (Signed)
  Subjective:    Patient ID: Lisa Hendrix, female    DOB: December 21, 1975, 35 y.o.   MRN: [id number]  HPI CPE- UTD on pap, too young for mammo and colonoscopy.  LBP- pain started 2-3 months ago, pain is worse w/ forward flexion.  Pain is located below area where soft tissue mass was removed.  Nothing relieves pain.   Review of Systems Patient reports no vision/ hearing changes, adenopathy,fever, weight change,  persistant/recurrent hoarseness , swallowing issues, chest pain, palpitations, edema, persistant/recurrent cough, hemoptysis, dyspnea (rest/exertional/paroxysmal nocturnal), gastrointestinal bleeding (melena, rectal bleeding), abdominal pain, significant heartburn, bowel changes, GU symptoms (dysuria, hematuria, incontinence), Gyn symptoms (abnormal  bleeding, pain),  syncope, focal weakness, memory loss, numbness & tingling, skin/hair/nail changes, abnormal bruising or bleeding, anxiety, or depression.     Objective:   Physical Exam  General Appearance:    Alert, cooperative, no distress, appears stated age  Head:    Normocephalic, without obvious abnormality, atraumatic  Eyes:    PERRL, conjunctiva/corneas clear, EOM's intact, fundi    benign, both eyes  Ears:    Normal TM's and external ear canals, both ears  Nose:   Nares normal, septum midline, mucosa normal, no drainage    or sinus tenderness  Throat:   Lips, mucosa, and tongue normal; teeth and gums normal  Neck:   Supple, symmetrical, trachea midline, no adenopathy;    Thyroid: no enlargement/tenderness/nodules  Back:     Symmetric, no curvature, ROM normal, no CVA tenderness  Lungs:     Clear to auscultation bilaterally, respirations unlabored  Chest Wall:    No tenderness or deformity   Heart:    Regular rate and rhythm, S1 and S2 normal, no murmur, rub   or gallop  Breast Exam:    Deferred to GYN  Abdomen:     Soft, non-tender, bowel sounds active all four quadrants,    no masses, no organomegaly  Genitalia:     Deferred to GYN  Rectal:    Extremities:   Extremities normal, atraumatic, no cyanosis or edema   BACK: TTP over SI joints bilaterally, pain w/ forward flexion, no pain w/ extension, (-) SLR bilaterally  Pulses:   2+ and symmetric all extremities  Skin:   Skin color, texture, turgor normal, no rashes or lesions  Lymph nodes:   Cervical, supraclavicular, and axillary nodes normal  Neurologic:   CNII-XII intact, normal strength, sensation and reflexes    throughout          Assessment & Plan:

## 2011-07-20 NOTE — Telephone Encounter (Signed)
Message copied by Beverely Low on Mon Jul 20, 2011  2:26 PM ------      Message from: Sheliah Hatch      Created: Mon Jul 20, 2011  2:13 PM       WBC is mildly low but stable when compared to other labs.  Mildly anemic but this is normal in menstruating women.  Remainder of labs look great!

## 2011-07-20 NOTE — Telephone Encounter (Signed)
Left message to call back  

## 2011-07-20 NOTE — Telephone Encounter (Signed)
Pt aware. Pt requesting refill of phenermine.  Refill denied, per Dr. Beverely Low. Pt had 3 month supply previously.

## 2011-07-20 NOTE — Patient Instructions (Signed)
Follow up in 1 year or as needed Start the Naprosyn twice daily w/ food for 7-10 days and then as needed for pain Use a heating pad for pain relief We'll notify you of your lab results Call with any questions or concerns Your exam looks great! Good luck next week!

## 2011-07-20 NOTE — Telephone Encounter (Signed)
Message copied by Beverely Low on Mon Jul 20, 2011  2:37 PM ------      Message from: Sheliah Hatch      Created: Mon Jul 20, 2011  2:13 PM       WBC is mildly low but stable when compared to other labs.  Mildly anemic but this is normal in menstruating women.  Remainder of labs look great!

## 2011-07-22 ENCOUNTER — Telehealth: Payer: Self-pay

## 2011-07-22 LAB — VITAMIN D 1,25 DIHYDROXY: Vitamin D 1, 25 (OH)2 Total: 36 pg/mL (ref 18–72)

## 2011-07-22 NOTE — Telephone Encounter (Signed)
Pt.notified

## 2011-07-22 NOTE — Telephone Encounter (Signed)
Message copied by Beverely Low on Wed Jul 22, 2011  2:40 PM ------      Message from: Sheliah Hatch      Created: Wed Jul 22, 2011  2:10 PM       Vit D is normal

## 2011-08-24 ENCOUNTER — Telehealth: Payer: Self-pay | Admitting: Family Medicine

## 2011-08-24 DIAGNOSIS — M549 Dorsalgia, unspecified: Secondary | ICD-10-CM

## 2011-08-24 NOTE — Telephone Encounter (Signed)
If this is not better from August (when pt was seen) will need to see Sports Med for further evaluation.  (Dr Pearletha Forge at [facility])

## 2011-08-24 NOTE — Telephone Encounter (Signed)
Patient was given naproxin for back pain - she was told if back pain isnt any better  she was to call  - wants to know what to do now

## 2011-08-25 NOTE — Telephone Encounter (Signed)
Left message for pt to call back  °

## 2011-08-28 ENCOUNTER — Ambulatory Visit: Payer: 59 | Admitting: Family Medicine

## 2011-09-17 ENCOUNTER — Inpatient Hospital Stay (INDEPENDENT_AMBULATORY_CARE_PROVIDER_SITE_OTHER)
Admission: RE | Admit: 2011-09-17 | Discharge: 2011-09-17 | Disposition: A | Discharge status: Home / Self Care | Payer: 59 | Source: Ambulatory Visit | Attending: Emergency Medicine | Admitting: Emergency Medicine

## 2011-09-17 DIAGNOSIS — J069 Acute upper respiratory infection, unspecified: Secondary | ICD-10-CM

## 2011-10-26 ENCOUNTER — Other Ambulatory Visit: Payer: Self-pay | Admitting: *Deleted

## 2011-10-26 NOTE — Telephone Encounter (Signed)
Pt left vm to request a medication, unable to clarify the medication from message. .left message to have patient return my call.

## 2011-10-28 ENCOUNTER — Other Ambulatory Visit: Payer: Self-pay | Admitting: *Deleted

## 2011-10-28 NOTE — Telephone Encounter (Signed)
Pt advised that she had been given a weightloss medication that she had been alternating and wanted to see if she could receive a refill on the medication. Last OV 09-17-11 refill for Phenimentrine 02-16-11 #30 with 2 refills

## 2011-10-28 NOTE — Telephone Encounter (Signed)
.  left message to have patient return my call. To advise that pt will need OV to discuss weight loss/management per medication not to be used more than 12 weeks

## 2011-10-28 NOTE — Telephone Encounter (Signed)
She would need an OV to discuss weight and weight loss- that med is not meant to be used for more than 12 weeks.

## 2011-11-19 ENCOUNTER — Encounter: Payer: Self-pay | Admitting: Family Medicine

## 2011-11-19 ENCOUNTER — Ambulatory Visit (INDEPENDENT_AMBULATORY_CARE_PROVIDER_SITE_OTHER): Payer: 59 | Admitting: Family Medicine

## 2011-11-19 VITALS — BP 108/70 | HR 64 | Temp 98.5°F | Wt 176.4 lb

## 2011-11-19 DIAGNOSIS — H6690 Otitis media, unspecified, unspecified ear: Secondary | ICD-10-CM

## 2011-11-19 MED ORDER — CLARITHROMYCIN ER 500 MG PO TB24: 1000.0000 mg | ORAL_TABLET | Freq: Every day | ORAL | Status: AC

## 2011-11-19 NOTE — Patient Instructions (Signed)
Serous Otitis Media   Serous otitis media is also known as otitis media with effusion (OME). It means there is fluid in the middle ear space. This space contains the bones for hearing and air. Air in the middle ear space helps to transmit sound.   The air gets there through the eustachian tube. This tube goes from the back of the throat to the middle ear space. It keeps the pressure in the middle ear the same as the outside world. It also helps to drain fluid from the middle ear space. CAUSES   OME occurs when the eustachian tube gets blocked. Blockage can come from:  Ear infections.     Colds and other upper respiratory infections.     Allergies.    Irritants such as cigarette smoke.     Sudden changes in air pressure (such as descending in an airplane).     Enlarged adenoids.  During colds and upper respiratory infections, the middle ear space can become temporarily filled with fluid. This can happen after an ear infection also. Once the infection clears, the fluid will generally drain out of the ear through the eustachian tube. If it does not, then OME occurs. SYMPTOMS    Hearing loss.     A feeling of fullness in the ear - but no pain.     Young children may not show any symptoms.  DIAGNOSIS    Diagnosis of OME is made by an ear exam.     Tests may be done to check on the movement of the eardrum.     Hearing exams may be done.  TREATMENT    The fluid most often goes away without treatment.     If allergy is the cause, allergy treatment may be helpful.     Fluid that persists for several months may require minor surgery. A small tube is placed in the ear drum to:     Drain the fluid.     Restore the air in the middle ear space.     In certain situations, antibiotics are used to avoid surgery.     Surgery may be done to remove enlarged adenoids (if this is the cause).  HOME CARE INSTRUCTIONS    Keep children away from tobacco smoke.     Be sure to keep follow up  appointments, if any.  SEEK MEDICAL CARE IF:    Hearing is not better in 3 months.     Hearing is worse.     Ear pain.     Drainage from the ear.     Dizziness.  Document Released: 02/06/2004 Document Revised: 07/29/2011 Document Reviewed: 12/06/2008 [employer] Patient Information 2012 [employer]. 

## 2011-11-20 NOTE — Progress Notes (Signed)
  Subjective:    Patient ID: Lisa Hendrix, female    DOB: 08/27/1976, 35 y.o.   MRN: [id number]  HPI  Pt c/o L ear pain x several days.  No other symptoms--no fever etc.     Review of Systems As above    Objective:   Physical Exam  Constitutional: She appears well-developed and well-nourished.  HENT:       L ear--TM dull-- + fluid  Neck: Normal range of motion. Neck supple.  Cardiovascular: Normal rate and regular rhythm.   Pulmonary/Chest: Effort normal and breath sounds normal.  Psychiatric: She has a normal mood and affect. Her behavior is normal.          Assessment & Plan:  Serous otitis----  Zyrtec,  dymista                           Call or rto prn

## 2011-12-22 ENCOUNTER — Other Ambulatory Visit: Payer: Self-pay | Admitting: *Deleted

## 2011-12-23 MED ORDER — ESOMEPRAZOLE MAGNESIUM 40 MG PO CPDR: 40.0000 mg | DELAYED_RELEASE_CAPSULE | Freq: Every day | ORAL | Status: DC

## 2011-12-23 MED ORDER — ACYCLOVIR 5 % EX OINT: TOPICAL_OINTMENT | CUTANEOUS | Status: DC

## 2011-12-23 MED ORDER — ACYCLOVIR 200 MG PO CAPS: 200.0000 mg | ORAL_CAPSULE | ORAL | Status: DC | PRN

## 2011-12-23 NOTE — Telephone Encounter (Signed)
rx sent to pharmacy by e-script For nexium and zovarex medication

## 2012-02-11 ENCOUNTER — Telehealth: Payer: Self-pay

## 2012-02-11 MED ORDER — ACYCLOVIR 400 MG PO TABS: 400.0000 mg | ORAL_TABLET | Freq: Three times a day (TID) | ORAL | Status: AC

## 2012-02-11 NOTE — Telephone Encounter (Signed)
Call from patient and she stated she has a fever blister and she is using the 200 mg of acyclovir and she normally gets 400 mg. I reviewed [facility] and Rx was for 400 mg.Marland KitchenMarland KitchenMarland KitchenSent with the same directions to [facility] outpatient pharmacy because 200 mg was not work.   KP

## 2012-02-22 ENCOUNTER — Emergency Department (HOSPITAL_COMMUNITY)
Admission: EM | Admit: 2012-02-22 | Discharge: 2012-02-22 | Disposition: A | Discharge status: Home / Self Care | Payer: 59 | Attending: Emergency Medicine | Admitting: Emergency Medicine

## 2012-02-22 ENCOUNTER — Telehealth: Payer: Self-pay | Admitting: *Deleted

## 2012-02-22 ENCOUNTER — Emergency Department (HOSPITAL_COMMUNITY): Payer: 59

## 2012-02-22 ENCOUNTER — Encounter (HOSPITAL_COMMUNITY): Payer: Self-pay | Admitting: Emergency Medicine

## 2012-02-22 DIAGNOSIS — R1909 Other intra-abdominal and pelvic swelling, mass and lump: Secondary | ICD-10-CM | POA: Insufficient documentation

## 2012-02-22 DIAGNOSIS — R51 Headache: Secondary | ICD-10-CM | POA: Insufficient documentation

## 2012-02-22 DIAGNOSIS — H53149 Visual discomfort, unspecified: Secondary | ICD-10-CM | POA: Insufficient documentation

## 2012-02-22 MED ORDER — HYDROCODONE-ACETAMINOPHEN 5-500 MG PO TABS: 1.0000 | ORAL_TABLET | Freq: Four times a day (QID) | ORAL | Status: AC | PRN

## 2012-02-22 MED ORDER — KETOROLAC TROMETHAMINE 60 MG/2ML IM SOLN
60.0000 mg | Freq: Once | INTRAMUSCULAR | Status: AC
  Administered 2012-02-22: 60 mg via INTRAMUSCULAR
  Filled 2012-02-22: qty 2

## 2012-02-22 NOTE — Telephone Encounter (Signed)
[facility] Triage Call Report Triage Record Num: [id number] Operator: Caswell Corwin Patient Name: Lisa Hendrix Call Date & Time: 02/22/2012 12:14:03PM Patient Phone: [phone] PCP: Lezlie Octave Patient Gender: Female PCP Fax : [phone] Patient DOB: 1976/08/05 Practice Name: [facility] Reason for Call: Caller: Carianne/Mother; PCP: Sheliah Hatch.; CB#: [phone]; Call regarding Headache Concerns. She was seen by Dr. Laury Axon 2 month ago for jaw pain. Now she is having nausea and blurred vision that started 02/20/12. Pain is continuous on her L side and ear feels like she has fullness and her L ear is popping. No sinus SX. Now has hazziness in the L eye. She has taken Ibupropfen, Sudaphed and nasal sprays and non effective. Triaged Headache and needs to be seen in the E/R as she si still ahving vision problems. She is at work at [employer] right now and will have someone take her to the E/R. Protocol(s) Used: Headache Recommended Outcome per Protocol: Activate EMS 911 Reason for Outcome: Sudden change in mental status or new change in speech or vision Care Advice: ~ 02/22/2012 12:25:26PM Page 1 of 1 [id number]

## 2012-02-22 NOTE — Discharge Instructions (Signed)

## 2012-02-22 NOTE — ED Notes (Signed)
Pt back from CT

## 2012-02-22 NOTE — ED Notes (Signed)
Pt c/o HA behind left eye x 3 days with blurry vision and halo and nausea; pt sts some nausea

## 2012-02-22 NOTE — Telephone Encounter (Signed)
Pt's sxs sound consistent w/ a migraine but due to severity of sxs and associated blurred vision would recommend being seen rather than just having MRI- ordering and getting study read can take time when not done through ER and if she is having serious problem the delay would not be appropriate.  I understand that she does not want to wait in ER but it really is the best option.  If she were my sister I would tell her the same thing.

## 2012-02-22 NOTE — Telephone Encounter (Signed)
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

## 2012-02-22 NOTE — ED Provider Notes (Signed)
History     CSN: [id number]  Arrival date & time 02/22/12  1537   First MD Initiated Contact with Patient 02/22/12 1957      Chief Complaint  Patient presents with  . Headache    (Consider location/radiation/quality/duration/timing/severity/associated sxs/prior treatment) HPI Comments: Reports seeing flashing light in left eye along with blurry vision.  Patient is a 36 y.o. female presenting with headaches. The history is provided by the patient.  Headache  This is a new problem. The current episode started 2 days ago. The problem occurs constantly. The problem has been gradually worsening. The headache is associated with bright light (computer screen). The pain is located in the frontal region. The quality of the pain is described as throbbing. The pain is moderate. The pain does not radiate. Pertinent negatives include no fever, no nausea and no vomiting. She has tried NSAIDs for the symptoms. The treatment provided mild relief.    History reviewed. No pertinent past medical history.  History reviewed. No pertinent past surgical history.  History reviewed. No pertinent family history.  History  Substance Use Topics  . Smoking status: Never Smoker   . Smokeless tobacco: Not on file  . Alcohol Use: Yes     socially    OB History    Grav Para Term Preterm Abortions TAB SAB Ect Mult Living                  Review of Systems  Constitutional: Negative for fever.  Gastrointestinal: Negative for nausea and vomiting.  Neurological: Positive for headaches.  All other systems reviewed and are negative.    Allergies  Penicillins  Home Medications   Current Outpatient Rx  Name Route Sig Dispense Refill  . ACYCLOVIR 200 MG PO CAPS Oral Take 200 mg by mouth as needed. For fever blisters.    . ACYCLOVIR 5 % EX OINT Topical Apply 1 application topically every 3 (three) hours as needed. For fever blisters.    Marland Kitchen ESOMEPRAZOLE MAGNESIUM 40 MG PO CPDR Oral Take 40 mg by mouth  daily.    . IBUPROFEN 800 MG PO TABS Oral Take 800 mg by mouth every 8 (eight) hours as needed. For pain.    Marland Kitchen NAPROXEN 500 MG PO TABS Oral Take 500 mg by mouth 2 (two) times daily with a meal.    . ACYCLOVIR 400 MG PO TABS Oral Take 1 tablet (400 mg total) by mouth 3 (three) times daily. 90 tablet 1    BP 122/83  Pulse 64  Temp(Src) 98.3 F (36.8 C) (Oral)  Resp 20  SpO2 100%  Physical Exam  Vitals reviewed. Constitutional: She is oriented to person, place, and time. She appears well-developed and well-nourished.  HENT:  Head: Normocephalic.  Right Ear: External ear normal.  Left Ear: External ear normal.  Mouth/Throat: Oropharynx is clear and moist. No oropharyngeal exudate.  Eyes: EOM are normal. Pupils are equal, round, and reactive to light.  Neck: Normal range of motion.  Abdominal: She exhibits pulsatile midline mass.  Musculoskeletal: Normal range of motion.  Neurological: She is alert and oriented to person, place, and time. No cranial nerve deficit. Coordination normal.  Skin: Skin is warm and dry.    ED Course  Procedures (including critical care time)  Labs Reviewed - No data to display No results found.   No diagnosis found.    MDM  The ct looks okay.  Will discharge to home with pain meds.  To return prn.  Geoffery Lyons, MD 02/22/12 2117

## 2012-02-22 NOTE — Telephone Encounter (Signed)
Noted a vm left from call a nurse stating that the pt had called in with severe left sided head pain accompanied with blurred vision, pt was advised to go to [facility] ER, called pt and she advised this was noted on Saturday, also noted bright haze on the side of her eye, when she turned that direction it became a bright light and stayed on the left side of her eye,notes that it feels like she has a 1,000 pins in her head, the pressure never released on her ear from last OV with MD Lowne, pt notes a lot of stress per mother dx with dementia at age 58yrs, pt notes that she left a vm with call a nurse advising that if MD Beverely Low places an order for pt to have an MRI that MD Shan Levans will complete the order per did not want pt to have to wait in the ED for this, note pt [employer] employee, please advise

## 2012-02-22 NOTE — ED Notes (Signed)
Pt has been experiencing increasing L temporal/L eye pain since Fri.  Photophobic and changes in vision are as if (heat is rising off road).  Pain increases on palpation of L forehead and L temporal area (pain is like pins and needles).  Pt also has the sensation that her L orbit is swollen.  No swelling noted.

## 2012-02-22 NOTE — Telephone Encounter (Signed)
Noted  

## 2012-02-24 ENCOUNTER — Ambulatory Visit: Payer: 59 | Admitting: Internal Medicine

## 2012-02-24 DIAGNOSIS — Z0289 Encounter for other administrative examinations: Secondary | ICD-10-CM

## 2012-05-10 ENCOUNTER — Telehealth: Payer: Self-pay | Admitting: Family Medicine

## 2012-05-10 MED ORDER — ESOMEPRAZOLE MAGNESIUM 40 MG PO CPDR: 40.0000 mg | DELAYED_RELEASE_CAPSULE | Freq: Every day | ORAL | Status: DC

## 2012-05-10 NOTE — Telephone Encounter (Signed)
Refill: Nexium 40mg  capsule #90. Take 1 capsule by mouth daily. Last fill 02-24-12

## 2012-05-10 NOTE — Telephone Encounter (Signed)
rx sent to pharmacy by e-script  

## 2012-06-01 ENCOUNTER — Ambulatory Visit: Payer: 59 | Admitting: Family Medicine

## 2012-09-05 ENCOUNTER — Other Ambulatory Visit: Payer: Self-pay | Admitting: Family Medicine

## 2012-09-06 MED ORDER — ACYCLOVIR 200 MG PO CAPS: 200.0000 mg | ORAL_CAPSULE | ORAL | Status: DC | PRN

## 2012-09-06 NOTE — Telephone Encounter (Signed)
Ok for #30, please remind pt she needs OV since last was more than 1 yr ago

## 2012-09-06 NOTE — Telephone Encounter (Signed)
Please advise if ok to refill per last OV 8-12 for CPE, cancelled apt noted

## 2012-09-06 NOTE — Telephone Encounter (Signed)
rx sent to pharmacy by e-script Pt made aware via my chart to schedule CPE at earliest convience

## 2012-11-30 HISTORY — PX: KNEE ARTHROSCOPY: SUR90

## 2013-01-14 ENCOUNTER — Other Ambulatory Visit: Payer: Self-pay

## 2013-04-25 ENCOUNTER — Encounter: Payer: Self-pay | Admitting: Physician Assistant

## 2013-05-04 ENCOUNTER — Ambulatory Visit: Payer: Self-pay | Admitting: Physician Assistant

## 2013-05-05 ENCOUNTER — Ambulatory Visit: Payer: Self-pay | Admitting: Physician Assistant

## 2013-05-26 ENCOUNTER — Ambulatory Visit: Payer: Self-pay | Admitting: Family Medicine

## 2013-10-05 ENCOUNTER — Other Ambulatory Visit: Payer: Self-pay

## 2014-05-21 ENCOUNTER — Encounter: Payer: Self-pay | Admitting: Family Medicine

## 2014-05-21 ENCOUNTER — Ambulatory Visit (INDEPENDENT_AMBULATORY_CARE_PROVIDER_SITE_OTHER): Payer: 59 | Admitting: Family Medicine

## 2014-05-21 VITALS — BP 118/82 | HR 63 | Temp 98.1°F | Wt 194.0 lb

## 2014-05-21 DIAGNOSIS — R05 Cough: Secondary | ICD-10-CM

## 2014-05-21 DIAGNOSIS — R059 Cough, unspecified: Secondary | ICD-10-CM

## 2014-05-21 DIAGNOSIS — J0100 Acute maxillary sinusitis, unspecified: Secondary | ICD-10-CM

## 2014-05-21 MED ORDER — TRIAMCINOLONE ACETONIDE 55 MCG/ACT NA AERO: 2.0000 | INHALATION_SPRAY | Freq: Every day | NASAL | Status: DC

## 2014-05-21 MED ORDER — CLARITHROMYCIN ER 500 MG PO TB24: 1000.0000 mg | ORAL_TABLET | Freq: Every day | ORAL | Status: AC

## 2014-05-21 MED ORDER — GUAIFENESIN-CODEINE 100-10 MG/5ML PO SYRP: 5.0000 mL | ORAL_SOLUTION | Freq: Three times a day (TID) | ORAL | Status: DC | PRN

## 2014-05-21 NOTE — Progress Notes (Signed)
  Subjective:     Lisa Hendrix is a 38 y.o. female who presents for evaluation of sinus pain. Symptoms include: congestion, cough, facial pain, nasal congestion, sinus pressure and cough is worse at night.. Onset of symptoms was 4 weeks ago. Symptoms have been gradually worsening since that time. Past history is significant for no history of pneumonia or bronchitis. Patient is a non-smoker.  Pt has used sudafed and otc cough meds with no relief.   The following portions of the patient's history were reviewed and updated as appropriate:  She  has no past medical history on file. She  does not have any pertinent problems on file. She  has no past surgical history on file. Her family history is not on file. She  reports that she has never smoked. She does not have any smokeless tobacco history on file. She reports that she drinks alcohol. She reports that she does not use illicit drugs. She has a current medication list which includes the following prescription(s): acyclovir and levonorgestrel. Current Outpatient Prescriptions on File Prior to Visit  Medication Sig Dispense Refill  . acyclovir (ZOVIRAX) 200 MG capsule Take 1 capsule (200 mg total) by mouth as needed. For fever blisters.  30 capsule  0   No current facility-administered medications on file prior to visit.   She is allergic to penicillins..  Review of Systems Pertinent items are noted in HPI.   Objective:    BP 118/82  Pulse 63  Temp(Src) 98.1 F (36.7 C) (Oral)  Wt 194 lb (87.998 kg)  SpO2 96% General appearance: alert, cooperative, appears stated age and no distress Ears: l ear + fluid Nose: green discharge, moderate congestion, turbinates red, swollen, sinus tenderness bilateral Throat: abnormal findings: + PND Neck: no adenopathy, supple, symmetrical, trachea midline and thyroid not enlarged, symmetric, no tenderness/mass/nodules Lungs: clear to auscultation bilaterally Heart: S1, S2 normal    Assessment:     Acute bacterial sinusitis.    Plan:    Nasal steroids per medication orders. Biaxin per medication orders. Follow up in several days or as needed.  --- prn Cough meds per orders

## 2014-05-21 NOTE — Progress Notes (Signed)
Pre visit review using our clinic review tool, if applicable. No additional management support is needed unless otherwise documented below in the visit note. 

## 2014-05-21 NOTE — Patient Instructions (Signed)
Sinusitis Sinusitis is redness, soreness, and swelling (inflammation) of the paranasal sinuses. Paranasal sinuses are air pockets within the bones of your face (beneath the eyes, the middle of the forehead, or above the eyes). In healthy paranasal sinuses, mucus is able to drain out, and air is able to circulate through them by way of your nose. However, when your paranasal sinuses are inflamed, mucus and air can become trapped. This can allow bacteria and other germs to grow and cause infection. Sinusitis can develop quickly and last only a short time (acute) or continue over a long period (chronic). Sinusitis that lasts for more than 12 weeks is considered chronic.  CAUSES  Causes of sinusitis include:  Allergies.  Structural abnormalities, such as displacement of the cartilage that separates your nostrils (deviated septum), which can decrease the air flow through your nose and sinuses and affect sinus drainage.  Functional abnormalities, such as when the small hairs (cilia) that line your sinuses and help remove mucus do not work properly or are not present. SYMPTOMS  Symptoms of acute and chronic sinusitis are the same. The primary symptoms are pain and pressure around the affected sinuses. Other symptoms include:  Upper toothache.  Earache.  Headache.  Bad breath.  Decreased sense of smell and taste.  A cough, which worsens when you are lying flat.  Fatigue.  Fever.  Thick drainage from your nose, which often is green and may contain pus (purulent).  Swelling and warmth over the affected sinuses. DIAGNOSIS  Your caregiver will perform a physical exam. During the exam, your caregiver may:  Look in your nose for signs of abnormal growths in your nostrils (nasal polyps).  Tap over the affected sinus to check for signs of infection.  View the inside of your sinuses (endoscopy) with a special imaging device with a light attached (endoscope), which is inserted into your  sinuses. If your caregiver suspects that you have chronic sinusitis, one or more of the following tests may be recommended:  Allergy tests.  Nasal culture--A sample of mucus is taken from your nose and sent to a lab and screened for bacteria.  Nasal cytology--A sample of mucus is taken from your nose and examined by your caregiver to determine if your sinusitis is related to an allergy. TREATMENT  Most cases of acute sinusitis are related to a viral infection and will resolve on their own within 10 days. Sometimes medicines are prescribed to help relieve symptoms (pain medicine, decongestants, nasal steroid sprays, or saline sprays).  However, for sinusitis related to a bacterial infection, your caregiver will prescribe antibiotic medicines. These are medicines that will help kill the bacteria causing the infection.  Rarely, sinusitis is caused by a fungal infection. In theses cases, your caregiver will prescribe antifungal medicine. For some cases of chronic sinusitis, surgery is needed. Generally, these are cases in which sinusitis recurs more than 3 times per year, despite other treatments. HOME CARE INSTRUCTIONS   Drink plenty of water. Water helps thin the mucus so your sinuses can drain more easily.  Use a humidifier.  Inhale steam 3 to 4 times a day (for example, sit in the bathroom with the shower running).  Apply a warm, moist washcloth to your face 3 to 4 times a day, or as directed by your caregiver.  Use saline nasal sprays to help moisten and clean your sinuses.  Take over-the-counter or prescription medicines for pain, discomfort, or fever only as directed by your caregiver. SEEK IMMEDIATE MEDICAL CARE IF:  You have increasing pain or severe headaches.  You have nausea, vomiting, or drowsiness.  You have swelling around your face.  You have vision problems.  You have a stiff neck.  You have difficulty breathing. MAKE SURE YOU:   Understand these  instructions.  Will watch your condition.  Will get help right away if you are not doing well or get worse. Document Released: 11/16/2005 Document Revised: 02/08/2012 Document Reviewed: 12/01/2011 [facility] Patient Information 2015 [place], [place]. This information is not intended to replace advice given to you by your health care provider. Make sure you discuss any questions you have with your health care provider.

## 2014-10-05 ENCOUNTER — Encounter: Payer: Self-pay | Admitting: Family Medicine

## 2014-10-05 ENCOUNTER — Ambulatory Visit (INDEPENDENT_AMBULATORY_CARE_PROVIDER_SITE_OTHER): Payer: 59 | Admitting: Family Medicine

## 2014-10-05 VITALS — BP 120/80 | HR 69 | Temp 98.1°F | Resp 16 | Ht 66.0 in | Wt 197.1 lb

## 2014-10-05 DIAGNOSIS — Z0000 Encounter for general adult medical examination without abnormal findings: Secondary | ICD-10-CM

## 2014-10-05 DIAGNOSIS — Z124 Encounter for screening for malignant neoplasm of cervix: Secondary | ICD-10-CM

## 2014-10-05 DIAGNOSIS — E669 Obesity, unspecified: Secondary | ICD-10-CM

## 2014-10-05 DIAGNOSIS — F418 Other specified anxiety disorders: Secondary | ICD-10-CM

## 2014-10-05 LAB — CBC WITH DIFFERENTIAL/PLATELET
BASOS ABS: 0 10*3/uL (ref 0.0–0.1)
Basophils Relative: 0.3 % (ref 0.0–3.0)
Eosinophils Absolute: 0 10*3/uL (ref 0.0–0.7)
Eosinophils Relative: 0.4 % (ref 0.0–5.0)
HEMATOCRIT: 40.6 % (ref 36.0–46.0)
HEMOGLOBIN: 13.4 g/dL (ref 12.0–15.0)
LYMPHS ABS: 1.9 10*3/uL (ref 0.7–4.0)
Lymphocytes Relative: 30.1 % (ref 12.0–46.0)
MCHC: 33 g/dL (ref 30.0–36.0)
MCV: 92.7 fl (ref 78.0–100.0)
MONO ABS: 0.3 10*3/uL (ref 0.1–1.0)
MONOS PCT: 5.1 % (ref 3.0–12.0)
NEUTROS ABS: 3.9 10*3/uL (ref 1.4–7.7)
Neutrophils Relative %: 64.1 % (ref 43.0–77.0)
PLATELETS: 186 10*3/uL (ref 150.0–400.0)
RBC: 4.39 Mil/uL (ref 3.87–5.11)
RDW: 13.3 % (ref 11.5–15.5)
WBC: 6.2 10*3/uL (ref 4.0–10.5)

## 2014-10-05 LAB — HEPATIC FUNCTION PANEL
ALBUMIN: 4 g/dL (ref 3.5–5.2)
ALK PHOS: 40 U/L (ref 39–117)
ALT: 10 U/L (ref 0–35)
AST: 21 U/L (ref 0–37)
BILIRUBIN DIRECT: 0.1 mg/dL (ref 0.0–0.3)
BILIRUBIN TOTAL: 0.4 mg/dL (ref 0.2–1.2)
Total Protein: 7.8 g/dL (ref 6.0–8.3)

## 2014-10-05 LAB — BASIC METABOLIC PANEL
BUN: 13 mg/dL (ref 6–23)
CHLORIDE: 107 meq/L (ref 96–112)
CO2: 22 mEq/L (ref 19–32)
Calcium: 9.1 mg/dL (ref 8.4–10.5)
Creatinine, Ser: 0.8 mg/dL (ref 0.4–1.2)
GFR: 82.58 mL/min (ref >60.00)
Glucose, Bld: 82 mg/dL (ref 70–99)
POTASSIUM: 4.5 meq/L (ref 3.5–5.1)
SODIUM: 140 meq/L (ref 135–145)

## 2014-10-05 LAB — LIPID PANEL
CHOL/HDL RATIO: 3
Cholesterol: 163 mg/dL (ref 0–200)
HDL: 57.9 mg/dL (ref >39.00)
LDL CALC: 96 mg/dL (ref 0–99)
NONHDL: 105.1
TRIGLYCERIDES: 44 mg/dL (ref 0.0–149.0)
VLDL: 8.8 mg/dL (ref 0.0–40.0)

## 2014-10-05 LAB — TSH: TSH: 1.05 u[IU]/mL (ref 0.35–4.50)

## 2014-10-05 LAB — VITAMIN D 25 HYDROXY (VIT D DEFICIENCY, FRACTURES): VITD: 31.6 ng/mL (ref 30.00–100.00)

## 2014-10-05 MED ORDER — FLUOXETINE HCL 20 MG PO TABS
20.0000 mg | ORAL_TABLET | Freq: Every day | ORAL | Status: DC
Start: 2014-10-05 — End: 2015-01-30

## 2014-10-05 NOTE — Assessment & Plan Note (Signed)
New to provider.  Pt is tearful, very stressed about her hectic life- work, 3 kids, mom w/ early dementia.  Pt's lack of attention is most likely due to stress level.  Start SSRI daily.  Will follow closely and adjust meds prn.

## 2014-10-05 NOTE — Progress Notes (Signed)
Pre visit review using our clinic review tool, if applicable. No additional management support is needed unless otherwise documented below in the visit note. 

## 2014-10-05 NOTE — Assessment & Plan Note (Signed)
New.  Pt has gained considerable weight since last visit.  Stressed need for regular exercise as both a means to weight loss and stress outlet.  Reviewed need for healthy diet.  Will follow.

## 2014-10-05 NOTE — Patient Instructions (Signed)
Follow up in 4-6 weeks to recheck mood We'll notify you of your lab results and make any changes if needed Start the Prozac daily for the mood Try and get regular exercise- it's a good stress outlet We'll call you with your GYN appt Call with any questions or concerns Hang in there!!!

## 2014-10-05 NOTE — Assessment & Plan Note (Signed)
Pt's PE WNL w/ exception of obesity.  Due for GYN- referral entered at pt's request.  Check labs.  Anticipatory guidance provided.

## 2014-10-05 NOTE — Progress Notes (Signed)
   Subjective:    Patient ID: Lisa Hendrix, female    DOB: 01/31/1976, 38 y.o.   MRN: [id number]  HPI CPE- overdue on GYN, has IUD in place.  Thinks she previously saw [facility].  Obesity- pt feels that she has gained 50 lbs in the last 2 yrs.  Pt has 3 kids and is very busy.  Not eating until after 8pm at night, very limited time to exercise.  Also had knee surgery last winter.  'i'm a mess'- unable to concentrate, tearful, 'i'm just stressed'.  'there's not really a true reason'.   Review of Systems Patient reports no vision/ hearing changes, adenopathy,fever,  persistant/recurrent hoarseness , swallowing issues, chest pain, palpitations, edema, persistant/recurrent cough, hemoptysis, dyspnea (rest/exertional/paroxysmal nocturnal), gastrointestinal bleeding (melena, rectal bleeding), abdominal pain, significant heartburn, bowel changes, GU symptoms (dysuria, hematuria, incontinence), Gyn symptoms (abnormal  bleeding, pain),  syncope, focal weakness, memory loss, numbness & tingling, skin/hair/nail changes, abnormal bruising or bleeding.     Objective:   Physical Exam General Appearance:    Alert, cooperative, no distress, appears stated age, obese  Head:    Normocephalic, without obvious abnormality, atraumatic  Eyes:    PERRL, conjunctiva/corneas clear, EOM's intact, fundi    benign, both eyes  Ears:    Normal TM's and external ear canals, both ears  Nose:   Nares normal, septum midline, mucosa normal, no drainage    or sinus tenderness  Throat:   Lips, mucosa, and tongue normal; teeth and gums normal  Neck:   Supple, symmetrical, trachea midline, no adenopathy;    Thyroid: no enlargement/tenderness/nodules  Back:     Symmetric, no curvature, ROM normal, no CVA tenderness  Lungs:     Clear to auscultation bilaterally, respirations unlabored  Chest Wall:    No tenderness or deformity   Heart:    Regular rate and rhythm, S1 and S2 normal, no murmur, rub   or gallop    Breast Exam:    Deferred to GYN  Abdomen:     Soft, non-tender, bowel sounds active all four quadrants,    no masses, no organomegaly  Genitalia:    Deferred to GYN  Rectal:    Extremities:   Extremities normal, atraumatic, no cyanosis or edema  Pulses:   2+ and symmetric all extremities  Skin:   Skin color, texture, turgor normal, no rashes or lesions  Lymph nodes:   Cervical, supraclavicular, and axillary nodes normal  Neurologic:   CNII-XII intact, normal strength, sensation and reflexes    throughout          Assessment & Plan:

## 2014-10-22 LAB — HM PAP SMEAR: HM Pap smear: NORMAL

## 2014-11-13 ENCOUNTER — Telehealth: Payer: Self-pay | Admitting: Nurse Practitioner

## 2014-11-13 ENCOUNTER — Telehealth: Payer: Self-pay | Admitting: Family Medicine

## 2014-11-13 DIAGNOSIS — N3000 Acute cystitis without hematuria: Secondary | ICD-10-CM

## 2014-11-13 MED ORDER — SULFAMETHOXAZOLE-TRIMETHOPRIM 800-160 MG PO TABS: 1.0000 | ORAL_TABLET | Freq: Two times a day (BID) | ORAL | Status: DC

## 2014-11-13 NOTE — Telephone Encounter (Signed)
Per provider pt needs an appt. The reason why is we need pt to provide a urine specimen and if there is something in it we need to culture to find out what antibiotics will work. No antibiotics without an appt.

## 2014-11-13 NOTE — Progress Notes (Signed)
We are sorry that you are not feeling well.  Here is how we plan to help!  Based on what you shared with me it looks like you most likely have a simple urinary tract infection.  A UTI (Urinary Tract Infection) is a bacterial infection of the bladder.  Most cases of urinary tract infections are simple to treat but a key part of your care is to encourage you to drink plenty of fluids and watch your symptoms carefully.  I have prescribed Bactrim, a sulfa antibiotic, twice a day for 5 days.  Your symptoms should gradually improve. Call us if the burning in your urine worsens, you develop worsening fever, back pain or pelvic pain or if your symptoms do not resolve after completing the antibiotic.  Urinary tract infections can be prevented by drinking plenty of water to keep your body hydrated.  Also be sure when you wipe, wipe from front to back and don't hold it in!  If possible, empty your bladder every 4 hours.  Your e-visit answers were reviewed by a board certified advanced clinical practitioner to complete your personal care plan.  Depending on the condition, your plan could have included both over the counter or prescription medications.  Please review your pharmacy choice.  If there is a problem, you may call our nursing hot line at [phone] and have the prescription routed to another pharmacy.  Your safety is important to [place].  If you have drug allergies check your prescription carefully.    You can use MyChart to ask questions about today's visit, request a non-urgent call back, or ask for a work or school excuse.  You will get an e-mail in the next two days asking about your experience.  I hope that your e-visit has been valuable and will speed your recovery. Thank you for using e-visits.

## 2014-11-13 NOTE — Telephone Encounter (Signed)
Pt has UTI and requesting a rx, advised pt she would need an appointment pt stated she has had UTI in the past requesting MD to send rx to pharmacy.

## 2014-11-15 ENCOUNTER — Encounter: Payer: Self-pay | Admitting: General Practice

## 2014-11-15 NOTE — Telephone Encounter (Signed)
lvm advising pt of MD instructions

## 2014-11-16 ENCOUNTER — Ambulatory Visit: Payer: 59 | Admitting: Family Medicine

## 2014-11-26 ENCOUNTER — Ambulatory Visit (INDEPENDENT_AMBULATORY_CARE_PROVIDER_SITE_OTHER): Payer: 59 | Admitting: Family Medicine

## 2014-11-26 ENCOUNTER — Encounter: Payer: Self-pay | Admitting: Family Medicine

## 2014-11-26 VITALS — BP 116/67 | HR 63 | Temp 98.5°F | Resp 16 | Ht 66.0 in | Wt 202.0 lb

## 2014-11-26 DIAGNOSIS — F418 Other specified anxiety disorders: Secondary | ICD-10-CM

## 2014-11-26 DIAGNOSIS — M26629 Arthralgia of temporomandibular joint, unspecified side: Secondary | ICD-10-CM

## 2014-11-26 DIAGNOSIS — M2662 Arthralgia of temporomandibular joint: Secondary | ICD-10-CM

## 2014-11-26 NOTE — Progress Notes (Signed)
Pre visit review using our clinic review tool, if applicable. No additional management support is needed unless otherwise documented below in the visit note/SLS  

## 2014-11-26 NOTE — Progress Notes (Signed)
   Subjective:    Patient ID: Lisa Hendrix, female    DOB: 12/15/1975, 38 y.o.   MRN: [id number]  HPI  Depression/Anxiety- noted at last visit.  Started on Prozac.  Pt reports feeling better, less on edge.  Less tearful, less snappy, sleeping better.  L ear pain- pt has dental appt scheduled, wears mouth guard at night.  Had TMJ surgery on R years ago.   Review of Systems For ROS see HPI     Objective:   Physical Exam  Constitutional: She is oriented to person, place, and time. She appears well-developed and well-nourished. No distress.  HENT:  Head: Normocephalic and atraumatic.  Mouth/Throat: Oropharynx is clear and moist.  TMs WNL bilaterally + TTP over L TMJ/masseter  Neck: Normal range of motion. Neck supple.  Lymphadenopathy:    She has no cervical adenopathy.  Neurological: She is alert and oriented to person, place, and time.  Skin: Skin is warm and dry.  Psychiatric: She has a normal mood and affect. Her behavior is normal. Thought content normal.  Vitals reviewed.         Assessment & Plan:

## 2014-11-26 NOTE — Patient Instructions (Signed)
Follow up in 3 months to recheck weight loss progress Continue to work on healthy diet and regular Continue the Prozac daily Keep up the good work!  You look great! Call with any questions or concerns Happy New Year!!!

## 2014-12-02 DIAGNOSIS — M26629 Arthralgia of temporomandibular joint, unspecified side: Secondary | ICD-10-CM | POA: Insufficient documentation

## 2014-12-02 NOTE — Assessment & Plan Note (Signed)
New.  Reassured pt that L TM WNL.  Location of pain and pt's hx consistent w/ TMJ pain.  Pt already has dental appt upcoming.  NSAIDs prn.

## 2014-12-02 NOTE — Assessment & Plan Note (Signed)
Improved since starting prozac.  Pt is very happy w/ results.  No med changes at this time.  Will continue to follow.

## 2015-01-30 ENCOUNTER — Other Ambulatory Visit: Payer: Self-pay | Admitting: Family Medicine

## 2015-01-30 NOTE — Telephone Encounter (Signed)
Med filled.  

## 2015-02-18 ENCOUNTER — Encounter: Payer: Self-pay | Admitting: Medical

## 2015-02-18 ENCOUNTER — Ambulatory Visit (INDEPENDENT_AMBULATORY_CARE_PROVIDER_SITE_OTHER): Payer: 59 | Admitting: Medical

## 2015-02-18 VITALS — BP 108/57 | HR 61 | Temp 98.2°F | Ht 66.0 in | Wt 204.0 lb

## 2015-02-18 DIAGNOSIS — J029 Acute pharyngitis, unspecified: Secondary | ICD-10-CM | POA: Insufficient documentation

## 2015-02-18 DIAGNOSIS — J020 Streptococcal pharyngitis: Secondary | ICD-10-CM | POA: Diagnosis not present

## 2015-02-18 LAB — POCT RAPID STREP A (OFFICE): RAPID STREP A SCREEN: POSITIVE — AB

## 2015-02-18 MED ORDER — AZITHROMYCIN 250 MG PO TABS: ORAL_TABLET | ORAL | Status: DC

## 2015-02-18 NOTE — Patient Instructions (Addendum)
Acute pharyngitis Your strep test was positive. I am prescribing  Azithromycin antibiotic. Rest hydrate, tylenol for fever and warm salt water gargles. Follow up in 7 days or as needed.

## 2015-02-18 NOTE — Progress Notes (Signed)
Subjective:    Patient ID: Lisa Hendrix, female    DOB: [birth year], 39 y.o.   MRN: [id number]  HPI   Pt has sore throat for days.  Associated symptom.  Body aches- yes since Saturday. Fever- no Chills-no HA-no Neck symptoms-mild submandibular nodes and some both axillary areas. Hx of this in past when has strep Lymph node enlargement-yes Rash-no Painful swallowing-yes Recent strep contact-yes. In school.      Review of Systems  Constitutional: Negative for fever, chills and fatigue.  HENT: Positive for sinus pressure and sore throat. Negative for congestion and postnasal drip.   Respiratory: Negative for cough, choking, chest tightness and wheezing.   Cardiovascular: Negative for chest pain.  Gastrointestinal: Negative for abdominal pain.  Musculoskeletal: Positive for myalgias.       Neck and rib cage/axillary areas.  Hematological: Positive for adenopathy. Does not bruise/bleed easily.   Past Medical History  Diagnosis Date  . Depression     History   Social History  . Marital Status: Married    Spouse Name: N/A  . Number of Children: N/A  . Years of Education: N/A   Occupational History  . Not on file.   Social History Main Topics  . Smoking status: Never Smoker   . Smokeless tobacco: Not on file  . Alcohol Use: Yes     Comment: socially  . Drug Use: No  . Sexual Activity: Not on file   Other Topics Concern  . Not on file   Social History Narrative    Past Surgical History  Procedure Laterality Date  . Tumor removal      spine    Family History  Problem Relation Age of Onset  . Diabetes Mother   . Hyperlipidemia Mother   . Hypertension Mother   . Alcohol abuse Father   . Cancer Maternal Aunt     Breast  . Cancer Paternal Aunt     breast  . Cancer Maternal Grandmother     breast Cancer  . Diabetes Maternal Grandmother   . Heart disease Maternal Grandfather   . Diabetes Maternal Grandfather   . Cancer Paternal Grandmother      Breast    Allergies  Allergen Reactions  . Penicillins     Current Outpatient Prescriptions on File Prior to Visit  Medication Sig Dispense Refill  . acyclovir (ZOVIRAX) 200 MG capsule Take 1 capsule (200 mg total) by mouth as needed. For fever blisters. 30 capsule 0  . FLUoxetine (PROZAC) 20 MG capsule TAKE ONE CAPSULE BY MOUTH EVERY DAY 30 capsule 3  . levonorgestrel (MIRENA) 20 MCG/24HR IUD 1 each by Intrauterine route once.    . Probiotic Product (PROBIOTIC DAILY) CAPS Take 1 capsule by mouth daily. INSYNC    . triamcinolone (NASACORT AQ) 55 MCG/ACT AERO nasal inhaler Place 2 sprays into the nose daily. (Patient taking differently: Place 2 sprays into the nose daily as needed. ) 1 Inhaler 12   No current facility-administered medications on file prior to visit.    BP 108/57 mmHg  Pulse 61  Temp(Src) 98.2 F (36.8 C) (Oral)  Ht 5' 6"  (1.676 m)  Wt 204 lb (92.534 kg)  BMI 32.94 kg/m2  SpO2 61%      Objective:   Physical Exam  General- No acute distress, pleasant pt.  Neck- from, No nuccal rigidity, Mild submandibular node hypertrophy bilataeral.  Lungs- Clear even and unlabored.  Heart- Regular, rate and rhythm. HEENT- Head- normocephalic Eyes- PEERL bilaterally. Ears-  Canals clear, normal tm's bilaterally. Nose- No frontal or maxillary sinus tenderness to palpation. Turbinates normal. Throat- posterior pharynx shows   2+  tonsillar hypertrophy plus, bright  erythma,  discharge.   Neurologic- CN III- XII grossly intact. Axillary areas- no pain on palpation of axilary area. No lymph nodes felt.    Assessment & Plan:  Note in vitals o2 sat stated 61%. I am convinced this was error entry by lpn. I noticed this later. i did delete this from the vital section. Her pulse was 61 and I think lpn put this in 02 sat % section as well. Pt lungs were clear even and unlabroed. She reported no sob to me. No accessory muscles used. I did call pt and explained to her I think  there was an error entry by staff and that it was such a poor number that I needed to call and clarify. Pt called back and she thinks reading was 91%. She stated no sob or wheezing. I advised her I think 91% likley monitor was not kept on her finger long enough. If she has nay wheezing, shortness of breath or any throat closing up sensation then we need to see he. She agreed would let us know.

## 2015-02-18 NOTE — Progress Notes (Signed)
Pre visit review using our clinic review tool, if applicable. No additional management support is needed unless otherwise documented below in the visit note. 

## 2015-02-18 NOTE — Assessment & Plan Note (Signed)
Your strep test was positive. I am prescribing  Azithromycin antibiotic. Rest hydrate, tylenol for fever and warm salt water gargles. Follow up in 7 days or as needed.

## 2015-03-12 ENCOUNTER — Other Ambulatory Visit: Payer: Self-pay | Admitting: *Deleted

## 2015-03-12 MED ORDER — FLUTICASONE PROPIONATE 50 MCG/ACT NA SUSP: 2.0000 | Freq: Every day | NASAL | Status: DC

## 2015-03-13 ENCOUNTER — Encounter: Payer: Self-pay | Admitting: Family Medicine

## 2015-03-14 ENCOUNTER — Encounter: Payer: Self-pay | Admitting: Family Medicine

## 2015-03-14 ENCOUNTER — Ambulatory Visit (INDEPENDENT_AMBULATORY_CARE_PROVIDER_SITE_OTHER): Payer: 59 | Admitting: Family Medicine

## 2015-03-14 VITALS — BP 126/80 | HR 58 | Temp 98.1°F | Resp 17 | Wt 210.0 lb

## 2015-03-14 DIAGNOSIS — J0100 Acute maxillary sinusitis, unspecified: Secondary | ICD-10-CM

## 2015-03-14 MED ORDER — PROMETHAZINE-DM 6.25-15 MG/5ML PO SYRP: 5.0000 mL | ORAL_SOLUTION | Freq: Four times a day (QID) | ORAL | Status: DC | PRN

## 2015-03-14 MED ORDER — CLARITHROMYCIN 500 MG PO TABS: 500.0000 mg | ORAL_TABLET | Freq: Two times a day (BID) | ORAL | Status: AC

## 2015-03-14 NOTE — Assessment & Plan Note (Signed)
Pt's sxs and PE consistent w/ infxn.  Start abx that will cover URI, sinus infection, and possible residual strep- biaxin.  Start cough meds prn.  Reviewed supportive care and red flags that should prompt return.  Pt expressed understanding and is in agreement w/ plan.

## 2015-03-14 NOTE — Progress Notes (Signed)
   Subjective:    Patient ID: Lisa Hendrix, female    DOB: [birth year], 39 y.o.   MRN: [id number]  HPI URI- pt was seen 3/21 and dx'd w/ strep.  tx'd w/ Zpack due to PCN allergy.  Pt has not recovered and continues to have severe sore throat, productive cough, laryngitis, + sinus pain/pressure.  No fevers.  Bilateral ear fullness.  No N/V.  + body aches.   Review of Systems For ROS see HPI     Objective:   Physical Exam  Constitutional: She appears well-developed and well-nourished. No distress.  HENT:  Head: Normocephalic and atraumatic.  Right Ear: Tympanic membrane normal.  Left Ear: Tympanic membrane normal.  Nose: Mucosal edema and rhinorrhea present. Right sinus exhibits maxillary sinus tenderness and frontal sinus tenderness. Left sinus exhibits maxillary sinus tenderness and frontal sinus tenderness.  Mouth/Throat: Uvula is midline and mucous membranes are normal. Posterior oropharyngeal erythema present. No oropharyngeal exudate.  Eyes: Conjunctivae and EOM are normal. Pupils are equal, round, and reactive to light.  Neck: Normal range of motion. Neck supple.  Cardiovascular: Normal rate, regular rhythm and normal heart sounds.   Pulmonary/Chest: Effort normal and breath sounds normal. No respiratory distress. She has no wheezes.  Lymphadenopathy:    She has no cervical adenopathy.  Vitals reviewed.         Assessment & Plan:

## 2015-03-14 NOTE — Progress Notes (Signed)
Pre visit review using our clinic review tool, if applicable. No additional management support is needed unless otherwise documented below in the visit note. 

## 2015-03-14 NOTE — Patient Instructions (Signed)
Follow up as needed Start the Biaxin twice daily- take w/ food (salty food or citrus fruits will neutralize the metallic taste) Use the cough syrup as needed (will cause drowsiness) Mucinex DM for daytime cough/congestion w/o drowsiness Drink plenty of fluids REST! Call with any questions or concerns Hang in there!

## 2015-04-02 ENCOUNTER — Encounter: Payer: Self-pay | Admitting: Family Medicine

## 2015-04-24 ENCOUNTER — Encounter: Payer: Self-pay | Admitting: Family Medicine

## 2015-04-24 DIAGNOSIS — J0100 Acute maxillary sinusitis, unspecified: Secondary | ICD-10-CM

## 2015-04-25 ENCOUNTER — Other Ambulatory Visit: Payer: Self-pay | Admitting: Family Medicine

## 2015-04-25 MED ORDER — ACYCLOVIR 200 MG PO CAPS: 200.0000 mg | ORAL_CAPSULE | ORAL | Status: DC | PRN

## 2015-04-25 MED ORDER — PROMETHAZINE-DM 6.25-15 MG/5ML PO SYRP: 5.0000 mL | ORAL_SOLUTION | Freq: Four times a day (QID) | ORAL | Status: DC | PRN

## 2015-04-25 NOTE — Telephone Encounter (Signed)
Last seen and filled 03/14/15 #240 ml.    Please advise KP

## 2015-04-26 MED ORDER — TRIAMCINOLONE ACETONIDE 55 MCG/ACT NA AERO: 2.0000 | INHALATION_SPRAY | Freq: Every day | NASAL | Status: AC | PRN

## 2015-04-26 NOTE — Telephone Encounter (Signed)
Med filled.  

## 2015-04-26 NOTE — Addendum Note (Signed)
Addended by: Kris Hartmann on: 04/26/2015 11:33 AM   Modules accepted: Orders

## 2015-09-23 ENCOUNTER — Encounter: Payer: Self-pay | Admitting: Physician Assistant

## 2015-09-23 ENCOUNTER — Ambulatory Visit (INDEPENDENT_AMBULATORY_CARE_PROVIDER_SITE_OTHER): Payer: BC Managed Care – PPO | Admitting: Physician Assistant

## 2015-09-23 VITALS — BP 123/70 | HR 66 | Temp 97.6°F | Resp 16 | Ht 66.0 in | Wt 219.5 lb

## 2015-09-23 DIAGNOSIS — M5432 Sciatica, left side: Secondary | ICD-10-CM

## 2015-09-23 MED ORDER — METHYLPREDNISOLONE ACETATE 80 MG/ML IJ SUSP
80.0000 mg | Freq: Once | INTRAMUSCULAR | Status: AC
Start: 2015-09-23 — End: 2015-09-23
  Administered 2015-09-23: 80 mg via INTRAMUSCULAR

## 2015-09-23 MED ORDER — HYDROCODONE-ACETAMINOPHEN 10-325 MG PO TABS: 1.0000 | ORAL_TABLET | Freq: Three times a day (TID) | ORAL | Status: DC | PRN

## 2015-09-23 MED ORDER — METHYLPREDNISOLONE ACETATE 80 MG/ML IJ SUSP: 80.0000 mg | Freq: Once | INTRAMUSCULAR | Status: DC

## 2015-09-23 MED ORDER — METHYLPREDNISOLONE 4 MG PO TBPK: ORAL_TABLET | ORAL | Status: DC

## 2015-09-23 NOTE — Progress Notes (Signed)
Pre visit review using our clinic review tool, if applicable. No additional management support is needed unless otherwise documented below in the visit note/SLS  

## 2015-09-23 NOTE — Assessment & Plan Note (Signed)
80 IM Depomedrol given. Rx Medrol dose pack given to start tomorrow. Rx hydrocodone for pain relief. Supportive measures reviewed. If not resolving, will obtain imaging.

## 2015-09-23 NOTE — Patient Instructions (Signed)
Please start the pain medication prescription (Hydrocodone) as soon as you get home. The steroid shot given today should start to calm down pain and inflammation. Start the Medrol dose pack tomorrow, taking as directed.  No heavy lifting or overexertion. Apply heating pad to lower back in 10-minute intervals, 3-4 times per day.  Once pain starts to calm down a bit, start the exercises below:  Follow-up with me on Friday. If anything worsens, please go to the ER for pain management and assessment.  Sciatica With Rehab The sciatic nerve runs from the back down the leg and is responsible for sensation and control of the muscles in the back (posterior) side of the thigh, lower leg, and foot. Sciatica is a condition that is characterized by inflammation of this nerve.  SYMPTOMS   Signs of nerve damage, including numbness and/or weakness along the posterior side of the lower extremity.  Pain in the back of the thigh that may also travel down the leg.  Pain that worsens when sitting for long periods of time.  Occasionally, pain in the back or buttock. CAUSES  Inflammation of the sciatic nerve is the cause of sciatica. The inflammation is due to something irritating the nerve. Common sources of irritation include:  Sitting for long periods of time.  Direct trauma to the nerve.  Arthritis of the spine.  Herniated or ruptured disk.  Slipping of the vertebrae (spondylolisthesis).  Pressure from soft tissues, such as muscles or ligament-like tissue (fascia). RISK INCREASES WITH:  Sports that place pressure or stress on the spine (football or weightlifting).  Poor strength and flexibility.  Failure to warm up properly before activity.  Family history of low back pain or disk disorders.  Previous back injury or surgery.  Poor body mechanics, especially when lifting, or poor posture. PREVENTION   Warm up and stretch properly before activity.  Maintain physical  fitness:  Strength, flexibility, and endurance.  Cardiovascular fitness.  Learn and use proper technique, especially with posture and lifting. When possible, have coach correct improper technique.  Avoid activities that place stress on the spine. PROGNOSIS If treated properly, then sciatica usually resolves within 6 weeks. However, occasionally surgery is necessary.  RELATED COMPLICATIONS   Permanent nerve damage, including pain, numbness, tingle, or weakness.  Chronic back pain.  Risks of surgery: infection, bleeding, nerve damage, or damage to surrounding tissues. TREATMENT Treatment initially involves resting from any activities that aggravate your symptoms. The use of ice and medication may help reduce pain and inflammation. The use of strengthening and stretching exercises may help reduce pain with activity. These exercises may be performed at home or with referral to a therapist. A therapist may recommend further treatments, such as transcutaneous electronic nerve stimulation (TENS) or ultrasound. Your caregiver may recommend corticosteroid injections to help reduce inflammation of the sciatic nerve. If symptoms persist despite non-surgical (conservative) treatment, then surgery may be recommended. MEDICATION  If pain medication is necessary, then nonsteroidal anti-inflammatory medications, such as aspirin and ibuprofen, or other minor pain relievers, such as acetaminophen, are often recommended.  Do not take pain medication for 7 days before surgery.  Prescription pain relievers may be given if deemed necessary by your caregiver. Use only as directed and only as much as you need.  Ointments applied to the skin may be helpful.  Corticosteroid injections may be given by your caregiver. These injections should be reserved for the most serious cases, because they may only be given a certain number of times. HEAT  AND COLD  Cold treatment (icing) relieves pain and reduces  inflammation. Cold treatment should be applied for 10 to 15 minutes every 2 to 3 hours for inflammation and pain and immediately after any activity that aggravates your symptoms. Use ice packs or massage the area with a piece of ice (ice massage).  Heat treatment may be used prior to performing the stretching and strengthening activities prescribed by your caregiver, physical therapist, or athletic trainer. Use a heat pack or soak the injury in warm water. SEEK MEDICAL CARE IF:  Treatment seems to offer no benefit, or the condition worsens.  Any medications produce adverse side effects. EXERCISES  RANGE OF MOTION (ROM) AND STRETCHING EXERCISES - Sciatica Most people with sciatic will find that their symptoms worsen with either excessive bending forward (flexion) or arching at the low back (extension). The exercises which will help resolve your symptoms will focus on the opposite motion. Your physician, physical therapist or athletic trainer will help you determine which exercises will be most helpful to resolve your low back pain. Do not complete any exercises without first consulting with your clinician. Discontinue any exercises which worsen your symptoms until you speak to your clinician. If you have pain, numbness or tingling which travels down into your buttocks, leg or foot, the goal of the therapy is for these symptoms to move closer to your back and eventually resolve. Occasionally, these leg symptoms will get better, but your low back pain may worsen; this is typically an indication of progress in your rehabilitation. Be certain to be very alert to any changes in your symptoms and the activities in which you participated in the 24 hours prior to the change. Sharing this information with your clinician will allow him/her to most efficiently treat your condition. These exercises may help you when beginning to rehabilitate your injury. Your symptoms may resolve with or without further involvement  from your physician, physical therapist or athletic trainer. While completing these exercises, remember:   Restoring tissue flexibility helps normal motion to return to the joints. This allows healthier, less painful movement and activity.  An effective stretch should be held for at least 30 seconds.  A stretch should never be painful. You should only feel a gentle lengthening or release in the stretched tissue. FLEXION RANGE OF MOTION AND STRETCHING EXERCISES: STRETCH - Flexion, Single Knee to Chest   Lie on a firm bed or floor with both legs extended in front of you.  Keeping one leg in contact with the floor, bring your opposite knee to your chest. Hold your leg in place by either grabbing behind your thigh or at your knee.  Pull until you feel a gentle stretch in your low back. Hold __________ seconds.  Slowly release your grasp and repeat the exercise with the opposite side. Repeat __________ times. Complete this exercise __________ times per day.  STRETCH - Flexion, Double Knee to Chest  Lie on a firm bed or floor with both legs extended in front of you.  Keeping one leg in contact with the floor, bring your opposite knee to your chest.  Tense your stomach muscles to support your back and then lift your other knee to your chest. Hold your legs in place by either grabbing behind your thighs or at your knees.  Pull both knees toward your chest until you feel a gentle stretch in your low back. Hold __________ seconds.  Tense your stomach muscles and slowly return one leg at a time to the  floor. Repeat __________ times. Complete this exercise __________ times per day.  STRETCH - Low Trunk Rotation   Lie on a firm bed or floor. Keeping your legs in front of you, bend your knees so they are both pointed toward the ceiling and your feet are flat on the floor.  Extend your arms out to the side. This will stabilize your upper body by keeping your shoulders in contact with the  floor.  Gently and slowly drop both knees together to one side until you feel a gentle stretch in your low back. Hold for __________ seconds.  Tense your stomach muscles to support your low back as you bring your knees back to the starting position. Repeat the exercise to the other side. Repeat __________ times. Complete this exercise __________ times per day  EXTENSION RANGE OF MOTION AND FLEXIBILITY EXERCISES: STRETCH - Extension, Prone on Elbows  Lie on your stomach on the floor, a bed will be too soft. Place your palms about shoulder width apart and at the height of your head.  Place your elbows under your shoulders. If this is too painful, stack pillows under your chest.  Allow your body to relax so that your hips drop lower and make contact more completely with the floor.  Hold this position for __________ seconds.  Slowly return to lying flat on the floor. Repeat __________ times. Complete this exercise __________ times per day.  RANGE OF MOTION - Extension, Prone Press Ups  Lie on your stomach on the floor, a bed will be too soft. Place your palms about shoulder width apart and at the height of your head.  Keeping your back as relaxed as possible, slowly straighten your elbows while keeping your hips on the floor. You may adjust the placement of your hands to maximize your comfort. As you gain motion, your hands will come more underneath your shoulders.  Hold this position __________ seconds.  Slowly return to lying flat on the floor. Repeat __________ times. Complete this exercise __________ times per day.  STRENGTHENING EXERCISES - Sciatica  These exercises may help you when beginning to rehabilitate your injury. These exercises should be done near your "sweet spot." This is the neutral, low-back arch, somewhere between fully rounded and fully arched, that is your least painful position. When performed in this safe range of motion, these exercises can be used for people who  have either a flexion or extension based injury. These exercises may resolve your symptoms with or without further involvement from your physician, physical therapist or athletic trainer. While completing these exercises, remember:   Muscles can gain both the endurance and the strength needed for everyday activities through controlled exercises.  Complete these exercises as instructed by your physician, physical therapist or athletic trainer. Progress with the resistance and repetition exercises only as your caregiver advises.  You may experience muscle soreness or fatigue, but the pain or discomfort you are trying to eliminate should never worsen during these exercises. If this pain does worsen, stop and make certain you are following the directions exactly. If the pain is still present after adjustments, discontinue the exercise until you can discuss the trouble with your clinician. STRENGTHENING - Deep Abdominals, Pelvic Tilt   Lie on a firm bed or floor. Keeping your legs in front of you, bend your knees so they are both pointed toward the ceiling and your feet are flat on the floor.  Tense your lower abdominal muscles to press your low back into the floor.  This motion will rotate your pelvis so that your tail bone is scooping upwards rather than pointing at your feet or into the floor.  With a gentle tension and even breathing, hold this position for __________ seconds. Repeat __________ times. Complete this exercise __________ times per day.  STRENGTHENING - Abdominals, Crunches   Lie on a firm bed or floor. Keeping your legs in front of you, bend your knees so they are both pointed toward the ceiling and your feet are flat on the floor. Cross your arms over your chest.  Slightly tip your chin down without bending your neck.  Tense your abdominals and slowly lift your trunk high enough to just clear your shoulder blades. Lifting higher can put excessive stress on the low back and does not  further strengthen your abdominal muscles.  Control your return to the starting position. Repeat __________ times. Complete this exercise __________ times per day.  STRENGTHENING - Quadruped, Opposite UE/LE Lift  Assume a hands and knees position on a firm surface. Keep your hands under your shoulders and your knees under your hips. You may place padding under your knees for comfort.  Find your neutral spine and gently tense your abdominal muscles so that you can maintain this position. Your shoulders and hips should form a rectangle that is parallel with the floor and is not twisted.  Keeping your trunk steady, lift your right hand no higher than your shoulder and then your left leg no higher than your hip. Make sure you are not holding your breath. Hold this position __________ seconds.  Continuing to keep your abdominal muscles tense and your back steady, slowly return to your starting position. Repeat with the opposite arm and leg. Repeat __________ times. Complete this exercise __________ times per day.  STRENGTHENING - Abdominals and Quadriceps, Straight Leg Raise   Lie on a firm bed or floor with both legs extended in front of you.  Keeping one leg in contact with the floor, bend the other knee so that your foot can rest flat on the floor.  Find your neutral spine, and tense your abdominal muscles to maintain your spinal position throughout the exercise.  Slowly lift your straight leg off the floor about 6 inches for a count of 15, making sure to not hold your breath.  Still keeping your neutral spine, slowly lower your leg all the way to the floor. Repeat this exercise with each leg __________ times. Complete this exercise __________ times per day. POSTURE AND BODY MECHANICS CONSIDERATIONS - Sciatica Keeping correct posture when sitting, standing or completing your activities will reduce the stress put on different body tissues, allowing injured tissues a chance to heal and  limiting painful experiences. The following are general guidelines for improved posture. Your physician or physical therapist will provide you with any instructions specific to your needs. While reading these guidelines, remember:  The exercises prescribed by your provider will help you have the flexibility and strength to maintain correct postures.  The correct posture provides the optimal environment for your joints to work. All of your joints have less wear and tear when properly supported by a spine with good posture. This means you will experience a healthier, less painful body.  Correct posture must be practiced with all of your activities, especially prolonged sitting and standing. Correct posture is as important when doing repetitive low-stress activities (typing) as it is when doing a single heavy-load activity (lifting). RESTING POSITIONS Consider which positions are most painful for you  when choosing a resting position. If you have pain with flexion-based activities (sitting, bending, stooping, squatting), choose a position that allows you to rest in a less flexed posture. You would want to avoid curling into a fetal position on your side. If your pain worsens with extension-based activities (prolonged standing, working overhead), avoid resting in an extended position such as sleeping on your stomach. Most people will find more comfort when they rest with their spine in a more neutral position, neither too rounded nor too arched. Lying on a non-sagging bed on your side with a pillow between your knees, or on your back with a pillow under your knees will often provide some relief. Keep in mind, being in any one position for a prolonged period of time, no matter how correct your posture, can still lead to stiffness. PROPER SITTING POSTURE In order to minimize stress and discomfort on your spine, you must sit with correct posture Sitting with good posture should be effortless for a healthy body.  Returning to good posture is a gradual process. Many people can work toward this most comfortably by using various supports until they have the flexibility and strength to maintain this posture on their own. When sitting with proper posture, your ears will fall over your shoulders and your shoulders will fall over your hips. You should use the back of the chair to support your upper back. Your low back will be in a neutral position, just slightly arched. You may place a small pillow or folded towel at the base of your low back for support.  When working at a desk, create an environment that supports good, upright posture. Without extra support, muscles fatigue and lead to excessive strain on joints and other tissues. Keep these recommendations in mind: CHAIR:   A chair should be able to slide under your desk when your back makes contact with the back of the chair. This allows you to work closely.  The chair's height should allow your eyes to be level with the upper part of your monitor and your hands to be slightly lower than your elbows. BODY POSITION  Your feet should make contact with the floor. If this is not possible, use a foot rest.  Keep your ears over your shoulders. This will reduce stress on your neck and low back. INCORRECT SITTING POSTURES   If you are feeling tired and unable to assume a healthy sitting posture, do not slouch or slump. This puts excessive strain on your back tissues, causing more damage and pain. Healthier options include:  Using more support, like a lumbar pillow.  Switching tasks to something that requires you to be upright or walking.  Talking a brief walk.  Lying down to rest in a neutral-spine position. PROLONGED STANDING WHILE SLIGHTLY LEANING FORWARD  When completing a task that requires you to lean forward while standing in one place for a long time, place either foot up on a stationary 2-4 inch high object to help maintain the best posture. When both  feet are on the ground, the low back tends to lose its slight inward curve. If this curve flattens (or becomes too large), then the back and your other joints will experience too much stress, fatigue more quickly and can cause pain.  CORRECT STANDING POSTURES Proper standing posture should be assumed with all daily activities, even if they only take a few moments, like when brushing your teeth. As in sitting, your ears should fall over your shoulders and your  shoulders should fall over your hips. You should keep a slight tension in your abdominal muscles to brace your spine. Your tailbone should point down to the ground, not behind your body, resulting in an over-extended swayback posture.  INCORRECT STANDING POSTURES  Common incorrect standing postures include a forward head, locked knees and/or an excessive swayback. WALKING Walk with an upright posture. Your ears, shoulders and hips should all line-up. PROLONGED ACTIVITY IN A FLEXED POSITION When completing a task that requires you to bend forward at your waist or lean over a low surface, try to find a way to stabilize 3 of 4 of your limbs. You can place a hand or elbow on your thigh or rest a knee on the surface you are reaching across. This will provide you more stability so that your muscles do not fatigue as quickly. By keeping your knees relaxed, or slightly bent, you will also reduce stress across your low back. CORRECT LIFTING TECHNIQUES DO :   Assume a wide stance. This will provide you more stability and the opportunity to get as close as possible to the object which you are lifting.  Tense your abdominals to brace your spine; then bend at the knees and hips. Keeping your back locked in a neutral-spine position, lift using your leg muscles. Lift with your legs, keeping your back straight.  Test the weight of unknown objects before attempting to lift them.  Try to keep your elbows locked down at your sides in order get the best strength  from your shoulders when carrying an object.  Always ask for help when lifting heavy or awkward objects. INCORRECT LIFTING TECHNIQUES DO NOT:   Lock your knees when lifting, even if it is a small object.  Bend and twist. Pivot at your feet or move your feet when needing to change directions.  Assume that you cannot safely pick up a paperclip without proper posture.   This information is not intended to replace advice given to you by your health care provider. Make sure you discuss any questions you have with your health care provider.   Document Released: 11/16/2005 Document Revised: 04/02/2015 Document Reviewed: 02/28/2009 Elsevier Interactive Patient Education [employer].

## 2015-09-23 NOTE — Progress Notes (Signed)
Patient presents to clinic today c/o 1 day of low back pain, left-sided, radiating into buttock and down her left leg with mild tingling and numbness. Symptoms started yesterday after episode of prolonged sitting. Denies trauma or injury. Denies saddle anesthesia or change to bowel or bladder habits. Has taken Ibuprofen with little relief in symptoms.  Past Medical History  Diagnosis Date  . Depression     Current Outpatient Prescriptions on File Prior to Visit  Medication Sig Dispense Refill  . acyclovir (ZOVIRAX) 200 MG capsule Take 1 capsule (200 mg total) by mouth as needed. For fever blisters. 30 capsule 0  . levonorgestrel (MIRENA) 20 MCG/24HR IUD 1 each by Intrauterine route once.    . Probiotic Product (PROBIOTIC DAILY) CAPS Take 1 capsule by mouth daily. INSYNC    . triamcinolone (NASACORT AQ) 55 MCG/ACT AERO nasal inhaler Place 2 sprays into the nose daily as needed. 1 Inhaler 3  . FLUoxetine (PROZAC) 20 MG capsule TAKE ONE CAPSULE BY MOUTH EVERY DAY (Patient not taking: Reported on 03/14/2015) 30 capsule 3   No current facility-administered medications on file prior to visit.    Allergies  Allergen Reactions  . Penicillins Swelling    Family History  Problem Relation Age of Onset  . Diabetes Mother   . Hyperlipidemia Mother   . Hypertension Mother   . Alcohol abuse Father   . Cancer Maternal Aunt     Breast  . Cancer Paternal Aunt     breast  . Cancer Maternal Grandmother     breast Cancer  . Diabetes Maternal Grandmother   . Heart disease Maternal Grandfather   . Diabetes Maternal Grandfather   . Cancer Paternal Grandmother     Breast    Social History   Social History  . Marital Status: Married    Spouse Name: N/A  . Number of Children: N/A  . Years of Education: N/A   Social History Main Topics  . Smoking status: Never Smoker   . Smokeless tobacco: None  . Alcohol Use: Yes     Comment: socially  . Drug Use: No  . Sexual Activity: Not Asked    Other Topics Concern  . None   Social History Narrative   Review of Systems - See HPI.  All other ROS are negative.  BP 123/70 mmHg  Pulse 66  Temp(Src) 97.6 F (36.4 C) (Oral)  Resp 16  Ht 5' 6"  (1.676 m)  Wt 219 lb 8 oz (99.565 kg)  BMI 35.45 kg/m2  SpO2 100%  Physical Exam  Constitutional: She is oriented to person, place, and time and well-developed, well-nourished, and in no distress.  HENT:  Head: Normocephalic and atraumatic.  Eyes: Conjunctivae are normal.  Neck: Neck supple.  Cardiovascular: Normal rate, regular rhythm, normal heart sounds and intact distal pulses.   Pulmonary/Chest: Effort normal.  Musculoskeletal:       Cervical back: Normal.       Thoracic back: Normal.       Lumbar back: She exhibits pain. She exhibits no tenderness and no bony tenderness.  Neurological: She is alert and oriented to person, place, and time.  Skin: Skin is warm and dry. No rash noted.  Vitals reviewed.  No results found for this or any previous visit (from the past 2160 hour(s)).  Assessment/Plan: Sciatica of left side 80 IM Depomedrol given. Rx Medrol dose pack given to start tomorrow. Rx hydrocodone for pain relief. Supportive measures reviewed. If not resolving, will obtain  imaging.

## 2015-10-01 ENCOUNTER — Encounter: Payer: Self-pay | Admitting: Physician Assistant

## 2015-10-01 ENCOUNTER — Other Ambulatory Visit: Payer: Self-pay | Admitting: Physician Assistant

## 2015-10-01 DIAGNOSIS — M5440 Lumbago with sciatica, unspecified side: Secondary | ICD-10-CM

## 2015-10-02 ENCOUNTER — Telehealth: Payer: Self-pay | Admitting: *Deleted

## 2015-10-02 ENCOUNTER — Ambulatory Visit (HOSPITAL_BASED_OUTPATIENT_CLINIC_OR_DEPARTMENT_OTHER)
Admission: RE | Admit: 2015-10-02 | Discharge: 2015-10-02 | Disposition: A | Discharge status: Home / Self Care | Payer: BC Managed Care – PPO | Source: Ambulatory Visit | Attending: Physician Assistant | Admitting: Physician Assistant

## 2015-10-02 DIAGNOSIS — M5440 Lumbago with sciatica, unspecified side: Secondary | ICD-10-CM

## 2015-10-02 DIAGNOSIS — M545 Low back pain: Secondary | ICD-10-CM | POA: Diagnosis not present

## 2015-10-02 DIAGNOSIS — M5432 Sciatica, left side: Secondary | ICD-10-CM

## 2015-10-02 MED ORDER — METHYLPREDNISOLONE 4 MG PO TBPK
ORAL_TABLET | ORAL | Status: DC
Start: 2015-10-02 — End: 2016-02-19

## 2015-10-02 NOTE — Telephone Encounter (Signed)
Notes Recorded by Brunetta Jeans, PA-C on 10/02/2015 at 9:04 AM X-ray is negative for acute changes thankfully. IUD in place. If symptoms have not started resolving again, recommend repeat Medrol pack. Sometimes takes longer for inflammation to resolve. Ok to send in refill. Continue pain medication and stretches as pain starts to subside again.   Patient informed, understood & agreed to Medrol dose pack repeat; Rx sent to pharmacy/[facility]

## 2015-10-02 NOTE — Telephone Encounter (Signed)
-----   Message from Brunetta Jeans, PA-C sent at 10/02/2015  9:04 AM EDT ----- See previous note.

## 2015-12-20 ENCOUNTER — Telehealth: Payer: Self-pay | Admitting: Family

## 2015-12-20 DIAGNOSIS — J019 Acute sinusitis, unspecified: Secondary | ICD-10-CM

## 2015-12-20 MED ORDER — DOXYCYCLINE HYCLATE 100 MG PO TABS: 100.0000 mg | ORAL_TABLET | Freq: Two times a day (BID) | ORAL | Status: DC

## 2015-12-20 NOTE — Progress Notes (Signed)
We are sorry that you are not feeling well.  Here is how we plan to help!  Based on what you have shared with me it looks like you have sinusitis.  Sinusitis is inflammation and infection in the sinus cavities of the head.  Based on your presentation I believe you most likely have Acute Bacterial Sinusitis.  This is an infection caused by bacteria and is treated with antibiotics. I have prescribed Doxycycline 179m by mouth twice a day for 10 days.   You may use an oral decongestant such as Mucinex D or if you have glaucoma or high blood pressure use plain Mucinex. Saline nasal spray help and can safely be used as often as needed for congestion.  If you develop worsening sinus pain, fever or notice severe headache and vision changes, or if symptoms are not better after completion of antibiotic, please schedule an appointment with a health care provider.    Sinus infections are not as easily transmitted as other respiratory infection, however we still recommend that you avoid close contact with loved ones, especially the very young and elderly.  Remember to wash your hands thoroughly throughout the day as this is the number one way to prevent the spread of infection!  Home Care:  Only take medications as instructed by your medical team.  Complete the entire course of an antibiotic.  Do not take these medications with alcohol.  A steam or ultrasonic humidifier can help congestion.  You can place a towel over your head and breathe in the steam from hot water coming from a faucet.  Avoid close contacts especially the very young and the elderly.  Cover your mouth when you cough or sneeze.  Always remember to wash your hands.  Get Help Right Away If:  You develop worsening fever or sinus pain.  You develop a severe head ache or visual changes.  Your symptoms persist after you have completed your treatment plan.  Make sure you  Understand these instructions.  Will watch your  condition.  Will get help right away if you are not doing well or get worse.  Your e-visit answers were reviewed by a board certified advanced clinical practitioner to complete your personal care plan.  Depending on the condition, your plan could have included both over the counter or prescription medications.  If there is a problem please reply  once you have received a response from your provider.  Your safety is important to [place]  If you have drug allergies check your prescription carefully.    You can use MyChart to ask questions about today's visit, request a non-urgent call back, or ask for a work or school excuse for 24 hours related to this e-Visit. If it has been greater than 24 hours you will need to follow up with your provider, or enter a new e-Visit to address those concerns.  You will get an e-mail in the next two days asking about your experience.  I hope that your e-visit has been valuable and will speed your recovery. Thank you for using e-visits.

## 2016-02-17 ENCOUNTER — Telehealth: Payer: BC Managed Care – PPO | Admitting: Family

## 2016-02-17 DIAGNOSIS — J029 Acute pharyngitis, unspecified: Secondary | ICD-10-CM

## 2016-02-17 MED ORDER — BENZONATATE 100 MG PO CAPS: 100.0000 mg | ORAL_CAPSULE | Freq: Three times a day (TID) | ORAL | Status: DC | PRN

## 2016-02-17 MED ORDER — LEVOFLOXACIN 500 MG PO TABS: 500.0000 mg | ORAL_TABLET | Freq: Every day | ORAL | Status: DC

## 2016-02-17 NOTE — Progress Notes (Signed)
We are sorry that you are not feeling well.  Here is how we plan to help!  Based on what you have shared with me it looks like you have upper respiratory tract inflammation that has resulted in a significant cough.  Inflammation and infection in the upper respiratory tract is commonly called bronchitis and has four common causes:  Allergies, Viral Infections, Acid Reflux and Bacterial Infections.  Allergies, viruses and acid reflux are treated by controlling symptoms or eliminating the cause. An example might be a cough caused by taking certain blood pressure medications. You stop the cough by changing the medication. Another example might be a cough caused by acid reflux. Controlling the reflux helps control the cough.  Based on your presentation I believe you most likely have A cough due to bacteria.  When patients have a fever and a productive cough with a change in color or increased sputum production, we are concerned about bacterial bronchitis.  If left untreated it can progress to pneumonia.  If your symptoms do not improve with your treatment plan it is important that you contact your provider.   I hve prescribed Levofloxacin 500 mg daily for 7 days   In addition you may use A non-prescription cough medication called Mucinex DM: take 2 tablets every 12 hours. and A prescription cough medication called Tessalon Perles 175m. You may take 1-2 capsules every 8 hours as needed for your cough.    HOME CARE . Only take medications as instructed by your medical team. . Complete the entire course of an antibiotic. . Drink plenty of fluids and get plenty of rest. . Avoid close contacts especially the very young and the elderly . Cover your mouth if you cough or cough into your sleeve. . Always remember to wash your hands . A steam or ultrasonic humidifier can help congestion.    GET HELP RIGHT AWAY IF: . You develop worsening fever. . You become short of breath . You cough up blood. . Your  symptoms persist after you have completed your treatment plan MAKE SURE YOU   Understand these instructions.  Will watch your condition.  Will get help right away if you are not doing well or get worse.  Your e-visit answers were reviewed by a board certified advanced clinical practitioner to complete your personal care plan.  Depending on the condition, your plan could have included both over the counter or prescription medications. If there is a problem please reply  once you have received a response from your provider. Your safety is important to [place]  If you have drug allergies check your prescription carefully.    You can use MyChart to ask questions about today's visit, request a non-urgent call back, or ask for a work or school excuse for 24 hours related to this e-Visit. If it has been greater than 24 hours you will need to follow up with your provider, or enter a new e-Visit to address those concerns. You will get an e-mail in the next two days asking about your experience.  I hope that your e-visit has been valuable and will speed your recovery. Thank you for using e-visits.

## 2016-02-18 NOTE — Progress Notes (Signed)
Based on what you shared with me it looks like you have a serious condition that should be evaluated in a face to face office visit.  If you are having a true medical emergency please call 911.  If you need an urgent face to face visit, [facility] has four urgent care centers for your convenience.  . [facility] a Provider at this Location  [address] [place], [place] [address] . 8 am to 8 pm Monday-Friday . 9 am to 7 pm Saturday-Sunday  . [facility] [employer] a Provider at this Location  [address], [place], [place] [address] . 8 am to 8 pm Monday-Friday . 9 am to 6 pm Saturday . 11 am to 6 pm Sunday   . [facility] at [place] Get Driving Directions  [address] [place], [place] [address] . 8 am to 8 pm Monday-Friday . 9 am to 4 pm Saturday-Sunday   . [facility] (a walk in primary care provider)  [place] a Provider at this Location  [place], [place] [address] . 8 am to 8:30 pm Monday-Thursday . 8 am to 6 pm Friday . 8 am to 4 pm Saturday-Sunday   Your e-visit answers were reviewed by a board certified advanced clinical practitioner to complete your personal care plan.  Thank you for using e-Visits.

## 2016-02-19 ENCOUNTER — Ambulatory Visit (INDEPENDENT_AMBULATORY_CARE_PROVIDER_SITE_OTHER): Payer: 59 | Admitting: Physician Assistant

## 2016-02-19 ENCOUNTER — Ambulatory Visit (HOSPITAL_BASED_OUTPATIENT_CLINIC_OR_DEPARTMENT_OTHER)
Admission: RE | Admit: 2016-02-19 | Discharge: 2016-02-19 | Disposition: A | Discharge status: Home / Self Care | Payer: 59 | Source: Ambulatory Visit | Attending: Physician Assistant | Admitting: Physician Assistant

## 2016-02-19 ENCOUNTER — Encounter: Payer: Self-pay | Admitting: Physician Assistant

## 2016-02-19 VITALS — BP 110/84 | HR 102 | Temp 98.7°F | Ht 66.0 in | Wt 217.0 lb

## 2016-02-19 DIAGNOSIS — J209 Acute bronchitis, unspecified: Secondary | ICD-10-CM | POA: Insufficient documentation

## 2016-02-19 DIAGNOSIS — J111 Influenza due to unidentified influenza virus with other respiratory manifestations: Secondary | ICD-10-CM | POA: Diagnosis not present

## 2016-02-19 LAB — POCT INFLUENZA A/B
INFLUENZA A, POC: POSITIVE — AB
Influenza B, POC: NEGATIVE

## 2016-02-19 MED ORDER — OSELTAMIVIR PHOSPHATE 75 MG PO CAPS: 75.0000 mg | ORAL_CAPSULE | Freq: Two times a day (BID) | ORAL | Status: DC

## 2016-02-19 MED ORDER — HYDROCOD POLST-CPM POLST ER 10-8 MG/5ML PO SUER: 5.0000 mL | Freq: Two times a day (BID) | ORAL | Status: DC | PRN

## 2016-02-19 NOTE — Progress Notes (Signed)
Pre visit review using our clinic review tool, if applicable. No additional management support is needed unless otherwise documented below in the visit note. 

## 2016-02-19 NOTE — Patient Instructions (Signed)
Please go to the lab for blood work.  I will call you with your results. Please continue antibiotic. Take cough medication as directed and start the Tamiflu.  Stay well hydrated and rest. No work until fever is gone and you are feeling better

## 2016-02-20 DIAGNOSIS — J111 Influenza due to unidentified influenza virus with other respiratory manifestations: Secondary | ICD-10-CM | POA: Insufficient documentation

## 2016-02-20 NOTE — Assessment & Plan Note (Signed)
Flu swab positive. Less than 48 hours since symptom onset. Will begin Tamiflu due to severity of symptoms. Continue Levaquin while we get a CXR to r/o pneumonia giving severe symptoms and fever. Supportive measures reviewed. Rx Tussionex for cough.

## 2016-02-20 NOTE — Progress Notes (Signed)
Patient presents to clinic today c/o 2 days of cough, chest congestion and fatigue with sudden onset of chills, aches, sore throat and fever of 102. Patient submitted an e-visit on 1st day of symptoms and was started on Levaquin for bronchitis without improvement. Denies chest pain or SOB. 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  Past Medical History  Diagnosis Date  . Depression     Current Outpatient Prescriptions on File Prior to Visit  Medication Sig Dispense Refill  . acyclovir (ZOVIRAX) 200 MG capsule Take 1 capsule (200 mg total) by mouth as needed. For fever blisters. 30 capsule 0  . benzonatate (TESSALON PERLES) 100 MG capsule Take 1-2 capsules (100-200 mg total) by mouth every 8 (eight) hours as needed. 30 capsule 0  . levofloxacin (LEVAQUIN) 500 MG tablet Take 1 tablet (500 mg total) by mouth daily. 7 tablet 0  . levonorgestrel (MIRENA) 20 MCG/24HR IUD 1 each by Intrauterine route once.    . Probiotic Product (PROBIOTIC DAILY) CAPS Take 1 capsule by mouth daily. INSYNC    . triamcinolone (NASACORT AQ) 55 MCG/ACT AERO nasal inhaler Place 2 sprays into the nose daily as needed. 1 Inhaler 3  . FLUoxetine (PROZAC) 20 MG capsule TAKE ONE CAPSULE BY MOUTH EVERY DAY (Patient not taking: Reported on 03/14/2015) 30 capsule 3   No current facility-administered medications on file prior to visit.    Allergies  Allergen Reactions  . Penicillins Swelling    Family History  Problem Relation Age of Onset  . Diabetes Mother   . Hyperlipidemia Mother   . Hypertension Mother   . Alcohol abuse Father   . Cancer Maternal Aunt     Breast  . Cancer Paternal Aunt     breast  . Cancer Maternal Grandmother     breast Cancer  . Diabetes Maternal Grandmother   . Heart disease Maternal Grandfather   . Diabetes Maternal Grandfather   . Cancer Paternal Grandmother     Breast    Social History   Social History  . Marital Status: Married    Spouse Name: N/A  . Number of  Children: N/A  . Years of Education: N/A   Social History Main Topics  . Smoking status: Never Smoker   . Smokeless tobacco: None  . Alcohol Use: Yes     Comment: socially  . Drug Use: No  . Sexual Activity: Not Asked   Other Topics Concern  . None   Social History Narrative    Review of Systems - See HPI.  All other ROS are negative.  BP 110/84 mmHg  Pulse 102  Temp(Src) 98.7 F (37.1 C) (Oral)  Ht 5' 6"  (1.676 m)  Wt 217 lb (98.431 kg)  BMI 35.04 kg/m2  SpO2 98%  Physical Exam  Constitutional: She is oriented to person, place, and time and well-developed, well-nourished, and in no distress.  HENT:  Head: Normocephalic and atraumatic.  Right Ear: External ear normal.  Left Ear: External ear normal.  Nose: Nose normal.  Mouth/Throat: Oropharynx is clear and moist.  Eyes: Conjunctivae are normal.  Neck: Neck supple.  Cardiovascular: Normal rate, regular rhythm, normal heart sounds and intact distal pulses.   Pulmonary/Chest: Effort normal and breath sounds normal. No respiratory distress. She has no wheezes. She has no rales. She exhibits no tenderness.  Neurological: She is alert and oriented to person, place, and time.  Skin: Skin is warm and dry. No rash noted.  Psychiatric: Affect  normal.  Vitals reviewed.   Recent Results (from the past 2160 hour(s))  POCT Influenza A/B     Status: Abnormal   Collection Time: 02/19/16  6:42 PM  Result Value Ref Range   Influenza A, POC Positive (A) Negative   Influenza B, POC Negative Negative    Assessment/Plan: Influenza Flu swab positive. Less than 48 hours since symptom onset. Will begin Tamiflu due to severity of symptoms. Continue Levaquin while we get a CXR to r/o pneumonia giving severe symptoms and fever. Supportive measures reviewed. Rx Tussionex for cough.

## 2016-05-09 ENCOUNTER — Telehealth: Payer: 59 | Admitting: Nurse Practitioner

## 2016-05-09 DIAGNOSIS — N3000 Acute cystitis without hematuria: Secondary | ICD-10-CM

## 2016-05-09 MED ORDER — SULFAMETHOXAZOLE-TRIMETHOPRIM 800-160 MG PO TABS: 1.0000 | ORAL_TABLET | Freq: Two times a day (BID) | ORAL | Status: DC

## 2016-05-09 NOTE — Progress Notes (Signed)
We are sorry that you are not feeling well.  Here is how we plan to help!  Based on what you shared with me it looks like you most likely have a simple urinary tract infection.  A UTI (Urinary Tract Infection) is a bacterial infection of the bladder.  Most cases of urinary tract infections are simple to treat but a key part of your care is to encourage you to drink plenty of fluids and watch your symptoms carefully.  I have prescribed Bactrim, a sulfa antibiotic, twice a day for 5 days.  Your symptoms should gradually improve. Call us if the burning in your urine worsens, you develop worsening fever, back pain or pelvic pain or if your symptoms do not resolve after completing the antibiotic.  Urinary tract infections can be prevented by drinking plenty of water to keep your body hydrated.  Also be sure when you wipe, wipe from front to back and don't hold it in!  If possible, empty your bladder every 4 hours.  Your e-visit answers were reviewed by a board certified advanced clinical practitioner to complete your personal care plan.  Depending on the condition, your plan could have included both over the counter or prescription medications.  If there is a problem please reply  once you have received a response from your provider.  Your safety is important to [place].  If you have drug allergies check your prescription carefully.    You can use MyChart to ask questions about today's visit, request a non-urgent call back, or ask for a work or school excuse for 24 hours related to this e-Visit. If it has been greater than 24 hours you will need to follow up with your provider, or enter a new e-Visit to address those concerns.   You will get an e-mail in the next two days asking about your experience.  I hope that your e-visit has been valuable and will speed your recovery. Thank you for using e-visits.

## 2016-07-17 ENCOUNTER — Ambulatory Visit (INDEPENDENT_AMBULATORY_CARE_PROVIDER_SITE_OTHER): Payer: 59 | Admitting: Physician Assistant

## 2016-07-17 ENCOUNTER — Encounter: Payer: Self-pay | Admitting: Physician Assistant

## 2016-07-17 VITALS — BP 119/72 | HR 69 | Temp 98.1°F | Ht 66.0 in | Wt 216.6 lb

## 2016-07-17 DIAGNOSIS — G43709 Chronic migraine without aura, not intractable, without status migrainosus: Secondary | ICD-10-CM

## 2016-07-17 DIAGNOSIS — M25472 Effusion, left ankle: Secondary | ICD-10-CM

## 2016-07-17 DIAGNOSIS — R635 Abnormal weight gain: Secondary | ICD-10-CM

## 2016-07-17 DIAGNOSIS — M25471 Effusion, right ankle: Secondary | ICD-10-CM | POA: Diagnosis not present

## 2016-07-17 LAB — COMPREHENSIVE METABOLIC PANEL
ALT: 9 U/L (ref 0–35)
AST: 16 U/L (ref 0–37)
Albumin: 4.4 g/dL (ref 3.5–5.2)
Alkaline Phosphatase: 56 U/L (ref 39–117)
BUN: 12 mg/dL (ref 6–23)
CALCIUM: 9.5 mg/dL (ref 8.4–10.5)
CHLORIDE: 105 meq/L (ref 96–112)
CO2: 24 meq/L (ref 19–32)
Creatinine, Ser: 0.88 mg/dL (ref 0.40–1.20)
GFR: 75.43 mL/min (ref >60.00)
GLUCOSE: 78 mg/dL (ref 70–99)
Potassium: 4.5 mEq/L (ref 3.5–5.1)
Sodium: 139 mEq/L (ref 135–145)
Total Bilirubin: 0.2 mg/dL (ref 0.2–1.2)
Total Protein: 7.7 g/dL (ref 6.0–8.3)

## 2016-07-17 LAB — CBC
HCT: 40 % (ref 36.0–46.0)
HEMOGLOBIN: 13.6 g/dL (ref 12.0–15.0)
MCHC: 34 g/dL (ref 30.0–36.0)
MCV: 88.6 fl (ref 78.0–100.0)
PLATELETS: 210 10*3/uL (ref 150.0–400.0)
RBC: 4.51 Mil/uL (ref 3.87–5.11)
RDW: 13.3 % (ref 11.5–15.5)
WBC: 5.4 10*3/uL (ref 4.0–10.5)

## 2016-07-17 LAB — TSH: TSH: 1.49 u[IU]/mL (ref 0.35–4.50)

## 2016-07-17 LAB — T4, FREE: Free T4: 0.87 ng/dL (ref 0.60–1.60)

## 2016-07-17 MED ORDER — TOPIRAMATE 25 MG PO TABS: ORAL_TABLET | ORAL | 1 refills | Status: DC

## 2016-07-17 MED ORDER — SUMATRIPTAN SUCCINATE 25 MG PO TABS: 25.0000 mg | ORAL_TABLET | Freq: Once | ORAL | 0 refills | Status: DC

## 2016-07-17 MED ORDER — ACYCLOVIR 200 MG PO CAPS: 200.0000 mg | ORAL_CAPSULE | ORAL | 0 refills | Status: DC | PRN

## 2016-07-17 NOTE — Progress Notes (Signed)
Pre visit review using our clinic review tool, if applicable. No additional management support is needed unless otherwise documented below in the visit note. 

## 2016-07-17 NOTE — Patient Instructions (Signed)
Please go to the lab for blood work. I will call you with the results. Please start Taking as directed. Stay well hydrated, in stay active. Elevate legs on resting. We will alter your regimen based on results Follow-up with me in 3-4 weeks.

## 2016-07-17 NOTE — Progress Notes (Signed)
Patient presents to clinic today to discuss multiple issues.  Patient with history of migraines with aura, photophobia, nausea and vomiting. Was previously on Topamax with good relief of symptoms. Patient endorses she was without migraines for some time, even without medication but they have recurrence since the new year. Endorses 3-4 migraine headaches a week, lasting 2-3 hours per episode. It is not relieved with over-the-counter medication. Denies vision changes or altered mental status with headaches.  Patient also endorses gaining around 90 pounds over the past 4 years. Patient states she is eating a well-balanced diet, watching portion sizes. Endorses staying very active running after her children and exercising several times per week. Denies known history of thyroid abnormality.  Patient endorses intermittent swelling of bilateral hands and feet over the past few months. Endorses this is noted at the end of the day and resolves overnight. Denies increase in salt intake. Is trying to stay active. Again has gained 90 pounds in the past 4 years.  Patient is requesting refill of her lactic acyclovir. Tolerates well.    Past Medical History:  Diagnosis Date  . Depression     Current Outpatient Prescriptions on File Prior to Visit  Medication Sig Dispense Refill  . acyclovir (ZOVIRAX) 200 MG capsule Take 1 capsule (200 mg total) by mouth as needed. For fever blisters. 30 capsule 0  . benzonatate (TESSALON PERLES) 100 MG capsule Take 1-2 capsules (100-200 mg total) by mouth every 8 (eight) hours as needed. 30 capsule 0  . chlorpheniramine-HYDROcodone (TUSSIONEX PENNKINETIC ER) 10-8 MG/5ML SUER Take 5 mLs by mouth every 12 (twelve) hours as needed for cough. 140 mL 0  . FLUoxetine (PROZAC) 20 MG capsule TAKE ONE CAPSULE BY MOUTH EVERY DAY 30 capsule 3  . levofloxacin (LEVAQUIN) 500 MG tablet Take 1 tablet (500 mg total) by mouth daily. 7 tablet 0  . levonorgestrel (MIRENA) 20 MCG/24HR IUD  1 each by Intrauterine route once.    Marland Kitchen oseltamivir (TAMIFLU) 75 MG capsule Take 1 capsule (75 mg total) by mouth 2 (two) times daily. 10 capsule 0  . Probiotic Product (PROBIOTIC DAILY) CAPS Take 1 capsule by mouth daily. INSYNC    . sulfamethoxazole-trimethoprim (BACTRIM DS) 800-160 MG tablet Take 1 tablet by mouth 2 (two) times daily. 14 tablet 0  . triamcinolone (NASACORT AQ) 55 MCG/ACT AERO nasal inhaler Place 2 sprays into the nose daily as needed. 1 Inhaler 3   No current facility-administered medications on file prior to visit.     Allergies  Allergen Reactions  . Penicillins Swelling    Family History  Problem Relation Age of Onset  . Diabetes Mother   . Hyperlipidemia Mother   . Hypertension Mother   . Alcohol abuse Father   . Cancer Maternal Aunt     Breast  . Cancer Paternal Aunt     breast  . Cancer Maternal Grandmother     breast Cancer  . Diabetes Maternal Grandmother   . Heart disease Maternal Grandfather   . Diabetes Maternal Grandfather   . Cancer Paternal Grandmother     Breast    Social History   Social History  . Marital status: Married    Spouse name: N/A  . Number of children: N/A  . Years of education: N/A   Social History Main Topics  . Smoking status: Never Smoker  . Smokeless tobacco: None  . Alcohol use Yes     Comment: socially  . Drug use: No  . Sexual activity: Not  Asked   Other Topics Concern  . None   Social History Narrative  . None    Review of Systems - See HPI.  All other ROS are negative.  BP 119/72 (BP Location: Left Arm, Patient Position: Sitting, Cuff Size: Large)   Pulse 69   Temp 98.1 F (36.7 C) (Oral)   Ht _0  (1.676 m)   Wt 216 lb 9.6 oz (98.2 kg)   SpO2 99%   BMI 34.96 kg/m   Physical Exam  Constitutional: She is oriented to person, place, and time and well-developed, well-nourished, and in no distress.  HENT:  Head: Normocephalic and atraumatic.  Right Ear: External ear normal.  Left Ear:  External ear normal.  Nose: Nose normal.  Mouth/Throat: Oropharynx is clear and moist. No oropharyngeal exudate.  Eyes: Conjunctivae are normal. Pupils are equal, round, and reactive to light.  Neck: Neck supple.  Cardiovascular: Normal rate, regular rhythm, normal heart sounds and intact distal pulses.   Pulses:      Dorsalis pedis pulses are 2+ on the right side, and 2+ on the left side.       Posterior tibial pulses are 2+ on the right side, and 2+ on the left side.  No peripheral edema noted on examination. No skin changes indicated chronic venous stasis noted.  Pulmonary/Chest: Effort normal and breath sounds normal. No respiratory distress. She has no wheezes. She has no rales. She exhibits no tenderness.  Neurological: She is alert and oriented to person, place, and time.  Skin: Skin is warm and dry. No rash noted.  Psychiatric: Affect normal.  Vitals reviewed.  Assessment/Plan: 1. Chronic migraine without aura without status migrainosus, not intractable Will begin Topamax 25 mg QD x 5 days, then increase to 25 mg BID. Supportive measures reviewed. Rx imitrex for abortive therapy. Will check labs today.  - TSH - CBC - Comp Met (CMET) - topiramate (TOPAMAX) 25 MG tablet; Take 1 tablet by mouth daily x 5 days. Then increase to 1 tablet twice daily.  Dispense: 60 tablet; Refill: 1  2. Weight gain Will obtain lab panel today to further assess. Calorie counting reviewed with patient.  - TSH - T4, free - CBC - Comp Met (CMET)  3. Swelling of both ankles None noted on exam today. Weight a contributing factor. Discussed watching salt. Supportive measures reviewed. Will check CMP today.   Leeanne Rio, PA-C

## 2016-07-23 ENCOUNTER — Telehealth: Payer: Self-pay | Admitting: *Deleted

## 2016-07-23 NOTE — Telephone Encounter (Signed)
Message sent via my chart

## 2016-07-23 NOTE — Telephone Encounter (Signed)
-----   Message from Brunetta Jeans, PA-C sent at 07/20/2016  7:00 AM EDT ----- Labs look good. Thyroid panel is within normal limits. Nothing showing up that is contributing to her symptoms. Have her continue care discussed at visit. Will see her at follow-up in 3-4 weeks.

## 2016-08-13 ENCOUNTER — Other Ambulatory Visit: Payer: Self-pay | Admitting: Physician Assistant

## 2016-08-14 ENCOUNTER — Ambulatory Visit (INDEPENDENT_AMBULATORY_CARE_PROVIDER_SITE_OTHER): Payer: 59 | Admitting: Physician Assistant

## 2016-08-14 ENCOUNTER — Encounter: Payer: Self-pay | Admitting: Physician Assistant

## 2016-08-14 VITALS — BP 120/80 | HR 61 | Temp 98.1°F | Resp 16 | Ht 66.0 in | Wt 213.0 lb

## 2016-08-14 DIAGNOSIS — IMO0002 Reserved for concepts with insufficient information to code with codable children: Secondary | ICD-10-CM

## 2016-08-14 DIAGNOSIS — G43709 Chronic migraine without aura, not intractable, without status migrainosus: Secondary | ICD-10-CM

## 2016-08-14 NOTE — Patient Instructions (Signed)
Please continue the Topamax as directed. We will give a little longer to see if the increased dose will reduce frequency of headaches further. Make sure to not skip meals as this can be a trigger for migraine headaches.   Call me in 2 weeks to let me know how you are doing. We will alter regimen accordingly.

## 2016-08-14 NOTE — Progress Notes (Signed)
Patient presents to clinic today for follow-up of chronic migraines after starting Topamax for preventive therapy. Endorses taking 25 mg twice daily as directed. Has decreased to only 3 migraines since last visit. Endorses headaches are slightly milder but still with aura, nausea and vomiting. Denies new or worsening symptoms.    Past Medical History:  Diagnosis Date  . Depression     Current Outpatient Prescriptions on File Prior to Visit  Medication Sig Dispense Refill  . acyclovir (ZOVIRAX) 200 MG capsule Take 1 capsule (200 mg total) by mouth as needed. For fever blisters. 30 capsule 0  . levonorgestrel (MIRENA) 20 MCG/24HR IUD 1 each by Intrauterine route once.    . Probiotic Product (PROBIOTIC DAILY) CAPS Take 1 capsule by mouth daily. INSYNC    . SUMAtriptan (IMITREX) 25 MG tablet Take 1 tablet (25 mg total) by mouth once. May repeat in 2 hours if headache persists or recurs. 10 tablet 0  . topiramate (TOPAMAX) 25 MG tablet Take 1 tablet by mouth daily x 5 days. Then increase to 1 tablet twice daily. 60 tablet 1  . triamcinolone (NASACORT AQ) 55 MCG/ACT AERO nasal inhaler Place 2 sprays into the nose daily as needed. 1 Inhaler 3   No current facility-administered medications on file prior to visit.     Allergies  Allergen Reactions  . Penicillins Swelling    Family History  Problem Relation Age of Onset  . Diabetes Mother   . Hyperlipidemia Mother   . Hypertension Mother   . Alcohol abuse Father   . Cancer Maternal Aunt     Breast  . Cancer Paternal Aunt     breast  . Cancer Maternal Grandmother     breast Cancer  . Diabetes Maternal Grandmother   . Heart disease Maternal Grandfather   . Diabetes Maternal Grandfather   . Cancer Paternal Grandmother     Breast    Social History   Social History  . Marital status: Married    Spouse name: N/A  . Number of children: N/A  . Years of education: N/A   Social History Main Topics  . Smoking status: Never Smoker    . Smokeless tobacco: None  . Alcohol use Yes     Comment: socially  . Drug use: No  . Sexual activity: Not Asked   Other Topics Concern  . None   Social History Narrative  . None    Review of Systems - See HPI.  All other ROS are negative.  BP 120/80 (BP Location: Right Arm, Patient Position: Sitting, Cuff Size: Large)   Pulse 61   Temp 98.1 F (36.7 C) (Oral)   Resp 16   Ht _0  (1.676 m)   Wt 213 lb (96.6 kg)   SpO2 99%   BMI 34.38 kg/m   Physical Exam  Constitutional: She is oriented to person, place, and time and well-developed, well-nourished, and in no distress.  HENT:  Head: Normocephalic and atraumatic.  Cardiovascular: Normal rate, regular rhythm, normal heart sounds and intact distal pulses.   Pulmonary/Chest: Effort normal and breath sounds normal. No respiratory distress. She has no wheezes. She has no rales. She exhibits no tenderness.  Neurological: She is alert and oriented to person, place, and time.  Skin: Skin is warm and dry. No rash noted.  Vitals reviewed.   Recent Results (from the past 2160 hour(s))  TSH     Status: None   Collection Time: 07/17/16 11:54 AM  Result Value Ref  Range   TSH 1.49 0.35 - 4.50 uIU/mL  T4, free     Status: None   Collection Time: 07/17/16 11:54 AM  Result Value Ref Range   Free T4 0.87 0.60 - 1.60 ng/dL  CBC     Status: None   Collection Time: 07/17/16 11:54 AM  Result Value Ref Range   WBC 5.4 4.0 - 10.5 K/uL   RBC 4.51 3.87 - 5.11 Mil/uL   Platelets 210.0 150.0 - 400.0 K/uL   Hemoglobin 13.6 12.0 - 15.0 g/dL   HCT 40.0 36.0 - 46.0 %   MCV 88.6 78.0 - 100.0 fl   MCHC 34.0 30.0 - 36.0 g/dL   RDW 13.3 11.5 - 15.5 %  Comp Met (CMET)     Status: None   Collection Time: 07/17/16 11:54 AM  Result Value Ref Range   Sodium 139 135 - 145 mEq/L   Potassium 4.5 3.5 - 5.1 mEq/L   Chloride 105 96 - 112 mEq/L   CO2 24 19 - 32 mEq/L   Glucose, Bld 78 70 - 99 mg/dL   BUN 12 6 - 23 mg/dL   Creatinine, Ser 0.88 0.40  - 1.20 mg/dL   Total Bilirubin 0.2 0.2 - 1.2 mg/dL   Alkaline Phosphatase 56 39 - 117 U/L   AST 16 0 - 37 U/L   ALT 9 0 - 35 U/L   Total Protein 7.7 6.0 - 8.3 g/dL   Albumin 4.4 3.5 - 5.2 g/dL   Calcium 9.5 8.4 - 10.5 mg/dL   GFR 75.43 >60.00 mL/min    Assessment/Plan: 1. Chronic migraine Much improved with addition of Topamax twice daily. Still having about 1 per week. Feel this is on days she skips lunch and eats a late dinner. Discussed dietary and hydration recommendations. She will follow-up via MyChart in 2 weeks to let us know how things are going.    Leeanne Rio, PA-C

## 2016-09-14 ENCOUNTER — Other Ambulatory Visit: Payer: Self-pay | Admitting: Physician Assistant

## 2016-09-14 DIAGNOSIS — G43709 Chronic migraine without aura, not intractable, without status migrainosus: Secondary | ICD-10-CM

## 2016-09-16 ENCOUNTER — Encounter: Payer: Self-pay | Admitting: Physician Assistant

## 2016-09-28 ENCOUNTER — Encounter: Payer: Self-pay | Admitting: Physician Assistant

## 2016-09-30 ENCOUNTER — Encounter: Payer: Self-pay | Admitting: Physician Assistant

## 2016-09-30 ENCOUNTER — Other Ambulatory Visit: Payer: Self-pay | Admitting: Physician Assistant

## 2016-09-30 ENCOUNTER — Telehealth: Payer: Self-pay

## 2016-09-30 ENCOUNTER — Ambulatory Visit (INDEPENDENT_AMBULATORY_CARE_PROVIDER_SITE_OTHER): Payer: 59 | Admitting: Physician Assistant

## 2016-09-30 ENCOUNTER — Ambulatory Visit (HOSPITAL_BASED_OUTPATIENT_CLINIC_OR_DEPARTMENT_OTHER)
Admission: RE | Admit: 2016-09-30 | Discharge: 2016-09-30 | Disposition: A | Discharge status: Home / Self Care | Payer: 59 | Source: Ambulatory Visit | Attending: Physician Assistant | Admitting: Physician Assistant

## 2016-09-30 VITALS — BP 118/86 | HR 59 | Temp 97.7°F | Resp 16 | Ht 67.0 in | Wt 213.4 lb

## 2016-09-30 DIAGNOSIS — R1011 Right upper quadrant pain: Secondary | ICD-10-CM | POA: Diagnosis present

## 2016-09-30 DIAGNOSIS — K828 Other specified diseases of gallbladder: Secondary | ICD-10-CM

## 2016-09-30 LAB — CBC WITH DIFFERENTIAL/PLATELET
BASOS PCT: 0.4 % (ref 0.0–3.0)
Basophils Absolute: 0 10*3/uL (ref 0.0–0.1)
EOS ABS: 0 10*3/uL (ref 0.0–0.7)
Eosinophils Relative: 0.4 % (ref 0.0–5.0)
HEMATOCRIT: 37.6 % (ref 36.0–46.0)
Hemoglobin: 12.8 g/dL (ref 12.0–15.0)
LYMPHS ABS: 1.4 10*3/uL (ref 0.7–4.0)
LYMPHS PCT: 32.4 % (ref 12.0–46.0)
MCHC: 34 g/dL (ref 30.0–36.0)
MCV: 89.4 fl (ref 78.0–100.0)
MONOS PCT: 5.6 % (ref 3.0–12.0)
Monocytes Absolute: 0.3 10*3/uL (ref 0.1–1.0)
NEUTROS ABS: 2.7 10*3/uL (ref 1.4–7.7)
Neutrophils Relative %: 61.2 % (ref 43.0–77.0)
PLATELETS: 229 10*3/uL (ref 150.0–400.0)
RBC: 4.2 Mil/uL (ref 3.87–5.11)
RDW: 14.1 % (ref 11.5–15.5)
WBC: 4.5 10*3/uL (ref 4.0–10.5)

## 2016-09-30 LAB — COMPREHENSIVE METABOLIC PANEL
ALT: 11 U/L (ref 0–35)
AST: 15 U/L (ref 0–37)
Albumin: 4.3 g/dL (ref 3.5–5.2)
Alkaline Phosphatase: 58 U/L (ref 39–117)
BUN: 16 mg/dL (ref 6–23)
CALCIUM: 9.4 mg/dL (ref 8.4–10.5)
CHLORIDE: 108 meq/L (ref 96–112)
CO2: 24 meq/L (ref 19–32)
Creatinine, Ser: 0.96 mg/dL (ref 0.40–1.20)
GFR: 68.15 mL/min (ref >60.00)
Glucose, Bld: 84 mg/dL (ref 70–99)
Potassium: 4.1 mEq/L (ref 3.5–5.1)
Sodium: 139 mEq/L (ref 135–145)
Total Bilirubin: 0.3 mg/dL (ref 0.2–1.2)
Total Protein: 7.5 g/dL (ref 6.0–8.3)

## 2016-09-30 LAB — URINALYSIS, MICROSCOPIC ONLY: WBC, UA: NONE SEEN (ref 0–?)

## 2016-09-30 LAB — POCT URINALYSIS DIPSTICK
BILIRUBIN UA: NEGATIVE
Blood, UA: NEGATIVE
Glucose, UA: NEGATIVE
KETONES UA: NEGATIVE
LEUKOCYTES UA: NEGATIVE
Nitrite, UA: NEGATIVE
Protein, UA: NEGATIVE
Spec Grav, UA: >=1.03
Urobilinogen, UA: NEGATIVE
pH, UA: 6

## 2016-09-30 MED ORDER — MELOXICAM 15 MG PO TABS: 15.0000 mg | ORAL_TABLET | Freq: Every day | ORAL | 0 refills | Status: DC

## 2016-09-30 NOTE — Patient Instructions (Signed)
Please go to the lab for blood work. Then go downstairs to the imaging department (open at 8) to schedule your Ultrasound.  I will call with your results.  Avoid any heavy lifting or overexertion. Take the Meloxicam as directed with food.  If anything acutely worsens while we are waiting on results, or symptoms become more constant or longer, please go to the ER.

## 2016-09-30 NOTE — Telephone Encounter (Signed)
Spoke with patient. See result note.  Urgent referral to Gen Surg placed.

## 2016-09-30 NOTE — Progress Notes (Signed)
Pre visit review using our clinic review tool, if applicable. No additional management support is needed unless otherwise documented below in the visit note. 

## 2016-09-30 NOTE — Telephone Encounter (Signed)
Patient returned your call regarding lab results.

## 2016-09-30 NOTE — Progress Notes (Signed)
Patient presents to clinic today c/o 3 weeks of intermittent sharp R side pains lasting about 1-2 seconds before completely resolving. Pain is sharp and severe when present. No pain between episodes. ROM does not affect symptoms per patient. Meals do not affect pain. Denies heart burn, indigestion, nausea or vomiting. Patient denies change to bowels or bladders. Has averaged 1 bowel movement every 2-3 days since teenage years. Denies blood in stool. At onset symptoms were occurring maybe 3-5 times per day, again lasting second each. Since Sunday frequency has greatly increased, happening multiple times per hour. Denies increase in severity, duration or other characteristics.  Past Medical History:  Diagnosis Date  . Depression     Current Outpatient Prescriptions on File Prior to Visit  Medication Sig Dispense Refill  . acyclovir (ZOVIRAX) 200 MG capsule Take 1 capsule (200 mg total) by mouth as needed. For fever blisters. 30 capsule 0  . levonorgestrel (MIRENA) 20 MCG/24HR IUD 1 each by Intrauterine route once.    . Probiotic Product (PROBIOTIC DAILY) CAPS Take 1 capsule by mouth daily. INSYNC    . SUMAtriptan (IMITREX) 25 MG tablet TAKE 1 TABLET BY MOUTH ONCE. MAY REPEAT IN 2 HOURS IF HEADACHE PERSISTS 10 tablet 0  . topiramate (TOPAMAX) 25 MG tablet TAKE 1 TABLET BY MOUTH EVERY DAY FOR 5 DAYS,THEN TAKE 1 TABLET TWICE A DAY 60 tablet 1  . triamcinolone (NASACORT AQ) 55 MCG/ACT AERO nasal inhaler Place 2 sprays into the nose daily as needed. 1 Inhaler 3   No current facility-administered medications on file prior to visit.     Allergies  Allergen Reactions  . Penicillins Swelling    Family History  Problem Relation Age of Onset  . Diabetes Mother   . Hyperlipidemia Mother   . Hypertension Mother   . Alcohol abuse Father   . Cancer Maternal Aunt     Breast  . Cancer Paternal Aunt     breast  . Cancer Maternal Grandmother     breast Cancer  . Diabetes Maternal Grandmother   .  Heart disease Maternal Grandfather   . Diabetes Maternal Grandfather   . Cancer Paternal Grandmother     Breast    Social History   Social History  . Marital status: Married    Spouse name: N/A  . Number of children: N/A  . Years of education: N/A   Social History Main Topics  . Smoking status: Never Smoker  . Smokeless tobacco: None  . Alcohol use Yes     Comment: socially  . Drug use: No  . Sexual activity: Not Asked   Other Topics Concern  . None   Social History Narrative  . None   Review of Systems - See HPI.  All other ROS are negative.  BP 118/86 (BP Location: Left Arm, Patient Position: Sitting, Cuff Size: Large)   Pulse (!) 59   Temp 97.7 F (36.5 C) (Oral)   Resp 16   Ht 5' 7"  (1.702 m)   Wt 213 lb 6.4 oz (96.8 kg)   SpO2 96%   BMI 33.42 kg/m   Physical Exam  Constitutional: She is oriented to person, place, and time and well-developed, well-nourished, and in no distress.  HENT:  Head: Normocephalic and atraumatic.  Eyes: Conjunctivae are normal.  Neck: Neck supple.  Cardiovascular: Normal rate, regular rhythm, normal heart sounds and intact distal pulses.   Pulmonary/Chest: Effort normal and breath sounds normal. No respiratory distress. She has no wheezes. She has  no rales. She exhibits no tenderness.  Abdominal: Soft. Bowel sounds are normal. There is no hepatosplenomegaly. There is tenderness in the right upper quadrant. There is no rigidity, no guarding, no CVA tenderness and negative Murphy's sign. No hernia.  Neurological: She is alert and oriented to person, place, and time.  Skin: Skin is warm and dry. No rash noted.  Psychiatric: Affect normal.  Vitals reviewed.   Recent Results (from the past 2160 hour(s))  TSH     Status: None   Collection Time: 07/17/16 11:54 AM  Result Value Ref Range   TSH 1.49 0.35 - 4.50 uIU/mL  T4, free     Status: None   Collection Time: 07/17/16 11:54 AM  Result Value Ref Range   Free T4 0.87 0.60 - 1.60  ng/dL  CBC     Status: None   Collection Time: 07/17/16 11:54 AM  Result Value Ref Range   WBC 5.4 4.0 - 10.5 K/uL   RBC 4.51 3.87 - 5.11 Mil/uL   Platelets 210.0 150.0 - 400.0 K/uL   Hemoglobin 13.6 12.0 - 15.0 g/dL   HCT 40.0 36.0 - 46.0 %   MCV 88.6 78.0 - 100.0 fl   MCHC 34.0 30.0 - 36.0 g/dL   RDW 13.3 11.5 - 15.5 %  Comp Met (CMET)     Status: None   Collection Time: 07/17/16 11:54 AM  Result Value Ref Range   Sodium 139 135 - 145 mEq/L   Potassium 4.5 3.5 - 5.1 mEq/L   Chloride 105 96 - 112 mEq/L   CO2 24 19 - 32 mEq/L   Glucose, Bld 78 70 - 99 mg/dL   BUN 12 6 - 23 mg/dL   Creatinine, Ser 0.88 0.40 - 1.20 mg/dL   Total Bilirubin 0.2 0.2 - 1.2 mg/dL   Alkaline Phosphatase 56 39 - 117 U/L   AST 16 0 - 37 U/L   ALT 9 0 - 35 U/L   Total Protein 7.7 6.0 - 8.3 g/dL   Albumin 4.4 3.5 - 5.2 g/dL   Calcium 9.5 8.4 - 10.5 mg/dL   GFR 75.43 >60.00 mL/min    Assessment/Plan: 1. Colicky RUQ abdominal pain Concern for cholecystitis or choledocolithiasis. Negative Murphy sign on exam but significant RUQ tenderness present. Will obtain labs and STAT US Abdomen to further evaluate. Will alter regimen based on results. Pain management discussed. Alarm signs/symptoms reviewed that would prompt ER assessment.  - CBC w/Diff - Comp Met (CMET) - US Abdomen Limited RUQ; Future - POCT Urinalysis Dipstick - Urine Microscopic Only   Leeanne Rio, PA-C

## 2016-10-01 ENCOUNTER — Ambulatory Visit: Payer: Self-pay | Admitting: General Surgery

## 2016-10-01 NOTE — H&P (Signed)
History of Present Illness Lisa Spruce MD; 10/01/2016 4:41 PM) The patient is a 40 year old female who presents for evaluation of gall stones. Referred by Leeanne Rio. Patient has a rough year dealing with migraines and intermittent nausea. In the last month she developed right-sided abdominal pain occurring 5 times a day lasting for less than half an hour. Over the last few weeks this is worsened and become much more frequent and last week has been nearly constant. She describes pain as sharp and will take her breath away. It will also wake her up from sleep. Of note she has a bowel movement every 10 days or so. She notes that she does not eat much food at all on a daily basis. She has lost 8 pounds in the last 2 months. She has occasional vomiting.   Other Problems Malachi Bonds, CMA; 10/01/2016 4:41 PM) Back Pain Kidney Stone Migraine Headache  Past Surgical History Malachi Bonds, CMA; 10/01/2016 4:41 PM) Knee Surgery Left. Oral Surgery  Diagnostic Studies History Malachi Bonds, CMA; 10/01/2016 4:41 PM) Colonoscopy never Mammogram 1-3 years ago Pap Smear 1-5 years ago  Allergies Malachi Bonds, CMA; 10/01/2016 3:41 PM) Penicillins Swelling.  Medication History Malachi Bonds, CMA; 10/01/2016 3:43 PM) Acyclovir (200MG Capsule, Oral) Active. Topiramate (25MG Tablet, Oral) Active. SUMAtriptan Succinate (25MG Tablet, Oral) Active. Levonorgestrel (20MCG/24HR IUD, Intrauterine) Active. Meloxicam (15MG Tablet, Oral) Active. Triamcinolone Acetonide (55MCG/ACT Inhaler, Nasal) Active. Medications Reconciled  Social History Malachi Bonds, CMA; 10/01/2016 4:41 PM) Alcohol use Occasional alcohol use. Caffeine use Coffee. No drug use Tobacco use Never smoker.  Family History Malachi Bonds, CMA; 10/01/2016 4:41 PM) Alcohol Abuse Father. Arthritis Mother. Depression Mother. Diabetes Mellitus Mother. Hypertension Mother.  Pregnancy / Birth  History Malachi Bonds, CMA; 10/01/2016 4:41 PM) Age at menarche 85 years. Contraceptive History Intrauterine device. Gravida 3 Length (months) of breastfeeding 12-24 Maternal age 79-30 Para 3    Review of Systems Malachi Bonds CMA; 10/01/2016 4:41 PM) General Present- Appetite Loss. Not Present- Chills, Fatigue, Fever, Night Sweats, Weight Gain and Weight Loss. Skin Present- Dryness. Not Present- Change in Wart/Mole, Hives, Jaundice, New Lesions, Non-Healing Wounds, Rash and Ulcer. HEENT Present- Seasonal Allergies. Not Present- Earache, Hearing Loss, Hoarseness, Nose Bleed, Oral Ulcers, Ringing in the Ears, Sinus Pain, Sore Throat, Visual Disturbances, Wears glasses/contact lenses and Yellow Eyes. Gastrointestinal Present- Abdominal Pain, Bloating, Constipation, Gets full quickly at meals, Indigestion, Nausea and Vomiting. Not Present- Bloody Stool, Change in Bowel Habits, Chronic diarrhea, Difficulty Swallowing, Excessive gas, Hemorrhoids and Rectal Pain. Musculoskeletal Present- Back Pain. Not Present- Joint Pain, Joint Stiffness, Muscle Pain, Muscle Weakness and Swelling of Extremities.  Addendum Note(Marjorie Lussier A. Taden Witter MD; 10/01/2016 5:03 PM) Cardiac: no chest pain, no shortness of breath, no palpitations, no peripheral edema Resp: no cough, no shortness of breath, no productive cough, no wheezes Vasc: no foot lesions or discolorations, no leg pains, no history of stroke   Vitals (Chemira Jones CMA; 10/01/2016 3:41 PM) 10/01/2016 3:40 PM Weight: 208.4 lb Height: 67in Body Surface Area: 2.06 m Body Mass Index: 32.64 kg/m  Temp.: 98.13F  Pulse: 79 (Regular)  P.OX: 98% (Room air) BP: 138/90 (Sitting, Left Arm, Standard)       Physical Exam Lisa Spruce, MD; 10/01/2016 4:41 PM) General Mental Status-Alert. General Appearance-Cooperative. Orientation-Oriented X4. Posture-Normal posture.  Integumentary Global Assessment Normal Exam - Head/Face:  no rashes, ulcers, lesions or evidence of photo damage. No palpable nodules or masses and Neck: no visible lesions or palpable masses.  Head and Neck Head-normocephalic, atraumatic with no lesions or palpable masses. Face Global Assessment - atraumatic. Thyroid Gland Characteristics - normal size and consistency.  Eye Eyeball - Bilateral-Extraocular movements intact. Sclera/Conjunctiva - Bilateral-No scleral icterus, No Discharge.  ENMT Nose and Sinuses Nose - no deformities observed, no swelling present.  Chest and Lung Exam Palpation Normal exam - Non-tender. Auscultation Breath sounds - Normal.  Cardiovascular Auscultation Rhythm - Regular. Heart Sounds - S1 WNL and S2 WNL. Carotid arteries - No Carotid bruit.  Abdomen Inspection Normal Exam - No Visible peristalsis, No Abnormal pulsations and No Paradoxical movements. Palpation/Percussion Normal exam - Soft, No Rebound tenderness, No Rigidity (guarding), No hepatosplenomegaly and No Palpable abdominal masses. Tenderness - Right Upper Quadrant. Gallbladder - Negative Murphy's sign.  Peripheral Vascular Upper Extremity Palpation - Pulses bilaterally normal. Lower Extremity Palpation - Edema - Bilateral - No edema.  Neurologic Neurologic evaluation reveals -normal sensation and normal coordination.  Neuropsychiatric Mental status exam performed with findings of-able to articulate well with normal speech/language, rate, volume and coherence and thought content normal with ability to perform basic computations and apply abstract reasoning.  Musculoskeletal Normal Exam - Bilateral-Upper Extremity Strength Normal and Lower Extremity Strength Normal.    Assessment & Plan Lisa Spruce MD; 10/01/2016 4:42 PM) GALL BLADDER STONES (K80.20) Impression: 40 year old female with symptomatic gallstones concerning for acute on chronic cholecystitis. We discussed the etiology of gallstones and probably cause  pain. We discussed exacerbating factors including fatty meals. We discussed the details of surgery for removal of the gallbladder including general anesthesia, 4 small incisions in the patient's abdomen, removal of the patient's gallbladder with the liver and common bile duct, and most likely outpatient procedure. We discussed risks of common bile duct injury, cystic duct stump leak, injury to liver, bleeding, infection, need for open procedure, and post cholecystectomy syndrome. The patient showed good understanding and wanted to proceed with laparoscopic cholecystectomy. Current Plans The anatomy & physiology of hepatobiliary & pancreatic function was discussed. The pathophysiology of gallbladder dysfunction was discussed. Natural history risks without surgery was discussed. I feel the risks of no intervention will lead to serious problems that outweigh the operative risks; therefore, I recommended cholecystectomy to remove the pathology. I explained laparoscopic techniques with possible need for an open approach. Probable cholangiogram to evaluate the bilary tract was explained as well.  Risks such as bleeding, infection, abscess, leak, injury to other organs, need for further treatment, heart attack, death, and other risks were discussed. I noted a good likelihood this will help address the problem. Possibility that this will not correct all abdominal symptoms was explained. Goals of post-operative recovery were discussed as well. We will work to minimize complications. An educational handout further explaining the pathology and treatment options was given as well. Questions were answered. The patient expresses understanding & wishes to proceed with surgery.  Pt Education - Laparoscopic Cholecystectomy: gallbladder Pt Education - Pamphlet Given - Laparoscopic Gallbladder Surgery: discussed with patient and provided information. You are being scheduled for surgery - Our schedulers will call you.  You  should hear from our office's scheduling department within 5 working days about the location, date, and time of surgery. We try to make accommodations for patient's preferences in scheduling surgery, but sometimes the OR schedule or the surgeon's schedule prevents [place] from making those accommodations.  If you have not heard from our office [phone]) in 5 working days, call the office and ask for your surgeon's nurse.  If you have other  questions about your diagnosis, plan, or surgery, call the office and ask for your surgeon's nurse.

## 2016-10-02 ENCOUNTER — Encounter (HOSPITAL_COMMUNITY): Payer: Self-pay | Admitting: *Deleted

## 2016-10-02 NOTE — Progress Notes (Signed)
I instructed Mrs Castor to stop Meloxicam at this time.

## 2016-10-04 MED ORDER — GABAPENTIN 300 MG PO CAPS
300.0000 mg | ORAL_CAPSULE | ORAL | Status: AC
  Administered 2016-10-05: 300 mg via ORAL
  Filled 2016-10-04: qty 1

## 2016-10-04 MED ORDER — CELECOXIB 200 MG PO CAPS
400.0000 mg | ORAL_CAPSULE | ORAL | Status: AC
  Administered 2016-10-05: 400 mg via ORAL
  Filled 2016-10-04: qty 2

## 2016-10-04 MED ORDER — ACETAMINOPHEN 500 MG PO TABS
1000.0000 mg | ORAL_TABLET | ORAL | Status: AC
  Administered 2016-10-05: 1000 mg via ORAL
  Filled 2016-10-04: qty 2

## 2016-10-04 MED ORDER — CIPROFLOXACIN IN D5W 400 MG/200ML IV SOLN
400.0000 mg | INTRAVENOUS | Status: AC
  Administered 2016-10-05: 400 mg via INTRAVENOUS
  Filled 2016-10-04: qty 200

## 2016-10-05 ENCOUNTER — Ambulatory Visit (HOSPITAL_COMMUNITY)
Admission: RE | Admit: 2016-10-05 | Discharge: 2016-10-05 | Disposition: A | Discharge status: Home / Self Care | Payer: 59 | Source: Ambulatory Visit | Attending: General Surgery | Admitting: General Surgery

## 2016-10-05 ENCOUNTER — Encounter (HOSPITAL_COMMUNITY): Payer: Self-pay | Admitting: Critical Care Medicine

## 2016-10-05 ENCOUNTER — Ambulatory Visit (HOSPITAL_COMMUNITY): Payer: 59 | Admitting: Certified Registered"

## 2016-10-05 ENCOUNTER — Encounter (HOSPITAL_COMMUNITY)
Admission: RE | Disposition: A | Discharge status: Home / Self Care | Payer: Self-pay | Source: Ambulatory Visit | Attending: General Surgery

## 2016-10-05 DIAGNOSIS — K810 Acute cholecystitis: Secondary | ICD-10-CM | POA: Diagnosis present

## 2016-10-05 DIAGNOSIS — Z6833 Body mass index (BMI) 33.0-33.9, adult: Secondary | ICD-10-CM | POA: Diagnosis not present

## 2016-10-05 HISTORY — DX: Pleurisy: R09.1

## 2016-10-05 HISTORY — DX: Headache: R51

## 2016-10-05 HISTORY — DX: Headache, unspecified: R51.9

## 2016-10-05 HISTORY — DX: Family history of other specified conditions: Z84.89

## 2016-10-05 HISTORY — DX: Personal history of urinary calculi: Z87.442

## 2016-10-05 HISTORY — PX: CHOLECYSTECTOMY: SHX55

## 2016-10-05 LAB — PREGNANCY, URINE: PREG TEST UR: NEGATIVE

## 2016-10-05 SURGERY — LAPAROSCOPIC CHOLECYSTECTOMY
Anesthesia: General | Site: Abdomen

## 2016-10-05 MED ORDER — FENTANYL CITRATE (PF) 100 MCG/2ML IJ SOLN
INTRAMUSCULAR | Status: AC
  Filled 2016-10-05: qty 4

## 2016-10-05 MED ORDER — HYDROMORPHONE HCL 1 MG/ML IJ SOLN
INTRAMUSCULAR | Status: AC
  Filled 2016-10-05: qty 0.5

## 2016-10-05 MED ORDER — HYDROMORPHONE HCL 1 MG/ML IJ SOLN
0.2500 mg | INTRAMUSCULAR | Status: DC | PRN
  Administered 2016-10-05 (×2): 0.5 mg via INTRAVENOUS

## 2016-10-05 MED ORDER — LIDOCAINE 2% (20 MG/ML) 5 ML SYRINGE
INTRAMUSCULAR | Status: AC
  Filled 2016-10-05: qty 10

## 2016-10-05 MED ORDER — GLYCOPYRROLATE 0.2 MG/ML IV SOSY
PREFILLED_SYRINGE | INTRAVENOUS | Status: DC | PRN
  Administered 2016-10-05: .2 mg via INTRAVENOUS

## 2016-10-05 MED ORDER — 0.9 % SODIUM CHLORIDE (POUR BTL) OPTIME
TOPICAL | Status: DC | PRN
  Administered 2016-10-05: 1000 mL

## 2016-10-05 MED ORDER — IBUPROFEN 800 MG PO TABS: 800.0000 mg | ORAL_TABLET | Freq: Three times a day (TID) | ORAL | 0 refills | Status: DC | PRN

## 2016-10-05 MED ORDER — PROPOFOL 10 MG/ML IV BOLUS
INTRAVENOUS | Status: DC | PRN
  Administered 2016-10-05: 200 mg via INTRAVENOUS

## 2016-10-05 MED ORDER — CELECOXIB 200 MG PO CAPS
ORAL_CAPSULE | ORAL | Status: AC
  Filled 2016-10-05: qty 1

## 2016-10-05 MED ORDER — PROPOFOL 10 MG/ML IV BOLUS
INTRAVENOUS | Status: AC
  Filled 2016-10-05: qty 20

## 2016-10-05 MED ORDER — BUPIVACAINE HCL (PF) 0.25 % IJ SOLN
INTRAMUSCULAR | Status: AC
  Filled 2016-10-05: qty 30

## 2016-10-05 MED ORDER — CHLORHEXIDINE GLUCONATE CLOTH 2 % EX PADS: 6.0000 | MEDICATED_PAD | Freq: Once | CUTANEOUS | Status: DC

## 2016-10-05 MED ORDER — LIDOCAINE 2% (20 MG/ML) 5 ML SYRINGE
INTRAMUSCULAR | Status: AC
  Filled 2016-10-05: qty 5

## 2016-10-05 MED ORDER — HYDROMORPHONE HCL 1 MG/ML IJ SOLN
INTRAMUSCULAR | Status: AC
  Administered 2016-10-05: 0.5 mg via INTRAVENOUS
  Filled 2016-10-05: qty 0.5

## 2016-10-05 MED ORDER — SUCCINYLCHOLINE CHLORIDE 200 MG/10ML IV SOSY
PREFILLED_SYRINGE | INTRAVENOUS | Status: AC
  Filled 2016-10-05: qty 10

## 2016-10-05 MED ORDER — ONDANSETRON HCL 4 MG/2ML IJ SOLN
INTRAMUSCULAR | Status: AC
  Filled 2016-10-05: qty 2

## 2016-10-05 MED ORDER — PROMETHAZINE HCL 25 MG/ML IJ SOLN
6.2500 mg | INTRAMUSCULAR | Status: DC | PRN
  Administered 2016-10-05: 6.25 mg via INTRAVENOUS

## 2016-10-05 MED ORDER — HYDROCODONE-ACETAMINOPHEN 5-325 MG PO TABS
2.0000 | ORAL_TABLET | Freq: Once | ORAL | Status: AC
  Administered 2016-10-05: 2 via ORAL

## 2016-10-05 MED ORDER — HYDROCODONE-ACETAMINOPHEN 5-325 MG PO TABS
ORAL_TABLET | ORAL | Status: AC
  Administered 2016-10-05: 2 via ORAL
  Filled 2016-10-05: qty 2

## 2016-10-05 MED ORDER — FENTANYL CITRATE (PF) 100 MCG/2ML IJ SOLN
INTRAMUSCULAR | Status: DC | PRN
  Administered 2016-10-05: 100 ug via INTRAVENOUS
  Administered 2016-10-05 (×2): 50 ug via INTRAVENOUS

## 2016-10-05 MED ORDER — BUPIVACAINE HCL (PF) 0.25 % IJ SOLN
INTRAMUSCULAR | Status: DC | PRN
  Administered 2016-10-05: 24 mL

## 2016-10-05 MED ORDER — LACTATED RINGERS IV SOLN
INTRAVENOUS | Status: DC
  Administered 2016-10-05: 13:00:00 via INTRAVENOUS
  Administered 2016-10-05: 50 mL/h via INTRAVENOUS

## 2016-10-05 MED ORDER — HYDROCODONE-ACETAMINOPHEN 5-325 MG PO TABS: 1.0000 | ORAL_TABLET | Freq: Four times a day (QID) | ORAL | 0 refills | Status: DC | PRN

## 2016-10-05 MED ORDER — SUGAMMADEX SODIUM 200 MG/2ML IV SOLN
INTRAVENOUS | Status: AC
  Filled 2016-10-05: qty 2

## 2016-10-05 MED ORDER — ROCURONIUM BROMIDE 10 MG/ML (PF) SYRINGE
PREFILLED_SYRINGE | INTRAVENOUS | Status: DC | PRN
  Administered 2016-10-05: 50 mg via INTRAVENOUS

## 2016-10-05 MED ORDER — DEXAMETHASONE SODIUM PHOSPHATE 10 MG/ML IJ SOLN
INTRAMUSCULAR | Status: DC | PRN
  Administered 2016-10-05: 10 mg via INTRAVENOUS

## 2016-10-05 MED ORDER — SODIUM CHLORIDE 0.9 % IR SOLN
Status: DC | PRN
  Administered 2016-10-05: 1000 mL

## 2016-10-05 MED ORDER — ONDANSETRON HCL 4 MG/2ML IJ SOLN
INTRAMUSCULAR | Status: DC | PRN
  Administered 2016-10-05: 4 mg via INTRAVENOUS

## 2016-10-05 MED ORDER — DEXAMETHASONE SODIUM PHOSPHATE 10 MG/ML IJ SOLN
INTRAMUSCULAR | Status: AC
  Filled 2016-10-05: qty 1

## 2016-10-05 MED ORDER — SUGAMMADEX SODIUM 200 MG/2ML IV SOLN
INTRAVENOUS | Status: DC | PRN
  Administered 2016-10-05: 200 mg via INTRAVENOUS

## 2016-10-05 MED ORDER — PROMETHAZINE HCL 25 MG/ML IJ SOLN
INTRAMUSCULAR | Status: AC
  Filled 2016-10-05: qty 1

## 2016-10-05 MED ORDER — ROCURONIUM BROMIDE 10 MG/ML (PF) SYRINGE
PREFILLED_SYRINGE | INTRAVENOUS | Status: AC
  Filled 2016-10-05: qty 10

## 2016-10-05 MED ORDER — MIDAZOLAM HCL 5 MG/5ML IJ SOLN
INTRAMUSCULAR | Status: DC | PRN
  Administered 2016-10-05: 2 mg via INTRAVENOUS

## 2016-10-05 MED ORDER — MIDAZOLAM HCL 2 MG/2ML IJ SOLN
INTRAMUSCULAR | Status: AC
  Filled 2016-10-05: qty 2

## 2016-10-05 MED ORDER — LIDOCAINE 2% (20 MG/ML) 5 ML SYRINGE
INTRAMUSCULAR | Status: DC | PRN
  Administered 2016-10-05: 100 mg via INTRAVENOUS

## 2016-10-05 SURGICAL SUPPLY — 32 items
BLADE SURG ROTATE 9660 (MISCELLANEOUS) IMPLANT
CANISTER SUCTION 2500CC (MISCELLANEOUS) ×2 IMPLANT
CHLORAPREP W/TINT 26ML (MISCELLANEOUS) ×2 IMPLANT
CLIP LIGATING HEMO LOK XL GOLD (MISCELLANEOUS) ×2 IMPLANT
COVER SURGICAL LIGHT HANDLE (MISCELLANEOUS) ×2 IMPLANT
DERMABOND ADVANCED (GAUZE/BANDAGES/DRESSINGS) ×1
DERMABOND ADVANCED .7 DNX12 (GAUZE/BANDAGES/DRESSINGS) ×1 IMPLANT
ELECT REM PT RETURN 9FT ADLT (ELECTROSURGICAL) ×2
ELECTRODE REM PT RTRN 9FT ADLT (ELECTROSURGICAL) ×1 IMPLANT
GLOVE BIOGEL PI IND STRL 7.0 (GLOVE) ×1 IMPLANT
GLOVE BIOGEL PI INDICATOR 7.0 (GLOVE) ×1
GLOVE SURG SS PI 7.0 STRL IVOR (GLOVE) ×2 IMPLANT
GOWN STRL REUS W/ TWL LRG LVL3 (GOWN DISPOSABLE) ×3 IMPLANT
GOWN STRL REUS W/TWL LRG LVL3 (GOWN DISPOSABLE) ×3
GRASPER SUT TROCAR 14GX15 (MISCELLANEOUS) ×2 IMPLANT
KIT BASIN OR (CUSTOM PROCEDURE TRAY) ×2 IMPLANT
KIT ROOM TURNOVER OR (KITS) ×2 IMPLANT
NS IRRIG 1000ML POUR BTL (IV SOLUTION) ×2 IMPLANT
PAD ARMBOARD 7.5X6 YLW CONV (MISCELLANEOUS) ×2 IMPLANT
POUCH RETRIEVAL ECOSAC 10 (ENDOMECHANICALS) ×1 IMPLANT
POUCH RETRIEVAL ECOSAC 10MM (ENDOMECHANICALS) ×1
SCISSORS LAP 5X35 DISP (ENDOMECHANICALS) ×2 IMPLANT
SET IRRIG TUBING LAPAROSCOPIC (IRRIGATION / IRRIGATOR) ×2 IMPLANT
SLEEVE ENDOPATH XCEL 5M (ENDOMECHANICALS) ×4 IMPLANT
SPECIMEN JAR SMALL (MISCELLANEOUS) ×2 IMPLANT
SUT MNCRL AB 4-0 PS2 18 (SUTURE) ×2 IMPLANT
TOWEL OR 17X24 6PK STRL BLUE (TOWEL DISPOSABLE) ×2 IMPLANT
TOWEL OR 17X26 10 PK STRL BLUE (TOWEL DISPOSABLE) IMPLANT
TRAY LAPAROSCOPIC MC (CUSTOM PROCEDURE TRAY) ×2 IMPLANT
TROCAR XCEL 12X100 BLDLESS (ENDOMECHANICALS) ×2 IMPLANT
TROCAR XCEL NON-BLD 5MMX100MML (ENDOMECHANICALS) ×2 IMPLANT
TUBING INSUFFLATION (TUBING) ×2 IMPLANT

## 2016-10-05 NOTE — Op Note (Signed)
PATIENT:  Lisa Hendrix  40 y.o. female  PRE-OPERATIVE DIAGNOSIS:  acute choleystitis  POST-OPERATIVE DIAGNOSIS:  acute choleystitis  PROCEDURE:  Procedure(s): LAPAROSCOPIC CHOLECYSTECTOMY   SURGEON:  Surgeon(s): Arta Bruce Mickelle Goupil, MD  ASSISTANT: none  ANESTHESIA:   local and general  Indications for procedure: [name] is a 40 y.o. female with symptoms of Abdominal pain, RUQ pain and Vomiting consistent with gallbladder disease, Confirmed by Ultrasound.  Description of procedure: The patient was brought into the operative suite, placed supine. Anesthesia was administered with endotracheal tube. Patient was strapped in place and foot board was secured. All pressure points were offloaded by foam padding. The patient was prepped and draped in the usual sterile fashion.  A small incision was made to the right of the umbilicus. A 49m trocar was inserted into the peritoneal cavity with optical entry. Pneumoperitoneum was applied with high flow low pressure. 2 557mtrocars were placed in the RUQ. A 1253mrocar was placed in the subxiphoid space. All trocars sites were first anesthesized with 0.25% marcaine with epinephrine in the subcutaneous and preperitoneal layers. Next the patient was placed in reverse trendelenberg.   The gallbladder was retracted cephalad and lateral. The peritoneum was reflected off the infundibulum working lateral to medial. The cystic duct and cystic artery were identified and further dissection revealed a critical view. The cystic duct and cystic artery were doubly clipped and ligated.   The gallbladder was removed off the liver bed with cautery. The Gallbladder was placed in a specimen bag. The gallbladder fossa was irrigated and hemostasis was applied with cautery. The gallbladder was removed via the 58m49mocar. No dilation was required for removal, therefore no fascial closure was performed. Pneumoperitoneum was removed, all trocar were  removed. All incisions were closed with 4-0 monocryl subcuticular stitch. The patient woke from anesthesia and was brought to PACU in stable condition. All counts were correct  Findings: normal cystic triangle  Specimen: gallbladder  Blood loss: No intake/output data recorded. ml  Local anesthesia: 20ml65m5%8.65%aine  Complications: none  PLAN OF CARE: Discharge to home after PACU  PATIENT DISPOSITION:  PACU - hemodynamically stable.  Channel Papandrea Gurney Maxin. [facility] [place]

## 2016-10-05 NOTE — Anesthesia Procedure Notes (Addendum)
Procedure Name: Intubation Date/Time: 10/05/2016 12:20 PM Performed by: Merrilyn Puma B Pre-anesthesia Checklist: Patient identified, Emergency Drugs available, Suction available, Patient being monitored and Timeout performed Patient Re-evaluated:Patient Re-evaluated prior to inductionOxygen Delivery Method: Circle system utilized Preoxygenation: Pre-oxygenation with 100% oxygen Intubation Type: IV induction Ventilation: Mask ventilation without difficulty Laryngoscope Size: Mac and 3 Grade View: Grade I Tube type: Oral Tube size: 7.0 mm Number of attempts: 1 Airway Equipment and Method: Stylet Placement Confirmation: ETT inserted through vocal cords under direct vision,  positive ETCO2,  CO2 detector and breath sounds checked- equal and bilateral Secured at: 22 cm Tube secured with: Tape Dental Injury: Teeth and Oropharynx as per pre-operative assessment

## 2016-10-05 NOTE — Interval H&P Note (Signed)
History and Physical Interval Note:  10/05/2016 10:57 AM  Lisa Hendrix  has presented today for surgery, with the diagnosis of acute choleystitis  The various methods of treatment have been discussed with the patient and family. After consideration of risks, benefits and other options for treatment, the patient has consented to  Procedure(s): LAPAROSCOPIC CHOLECYSTECTOMY (N/A) as a surgical intervention .  The patient's history has been reviewed, patient examined, no change in status, stable for surgery.  I have reviewed the patient's chart and labs.  Questions were answered to the patient's satisfaction.     Arta Bruce Kinsinger

## 2016-10-05 NOTE — H&P (View-Only) (Signed)
History of Present Illness Lisa Spruce MD; 10/01/2016 4:41 PM) The patient is a 40 year old female who presents for evaluation of gall stones. Referred by Leeanne Rio. Patient has a rough year dealing with migraines and intermittent nausea. In the last month she developed right-sided abdominal pain occurring 5 times a day lasting for less than half an hour. Over the last few weeks this is worsened and become much more frequent and last week has been nearly constant. She describes pain as sharp and will take her breath away. It will also wake her up from sleep. Of note she has a bowel movement every 10 days or so. She notes that she does not eat much food at all on a daily basis. She has lost 8 pounds in the last 2 months. She has occasional vomiting.   Other Problems Malachi Bonds, CMA; 10/01/2016 4:41 PM) Back Pain Kidney Stone Migraine Headache  Past Surgical History Malachi Bonds, CMA; 10/01/2016 4:41 PM) Knee Surgery Left. Oral Surgery  Diagnostic Studies History Malachi Bonds, CMA; 10/01/2016 4:41 PM) Colonoscopy never Mammogram 1-3 years ago Pap Smear 1-5 years ago  Allergies Malachi Bonds, CMA; 10/01/2016 3:41 PM) Penicillins Swelling.  Medication History Malachi Bonds, CMA; 10/01/2016 3:43 PM) Acyclovir (200MG Capsule, Oral) Active. Topiramate (25MG Tablet, Oral) Active. SUMAtriptan Succinate (25MG Tablet, Oral) Active. Levonorgestrel (20MCG/24HR IUD, Intrauterine) Active. Meloxicam (15MG Tablet, Oral) Active. Triamcinolone Acetonide (55MCG/ACT Inhaler, Nasal) Active. Medications Reconciled  Social History Malachi Bonds, CMA; 10/01/2016 4:41 PM) Alcohol use Occasional alcohol use. Caffeine use Coffee. No drug use Tobacco use Never smoker.  Family History Malachi Bonds, CMA; 10/01/2016 4:41 PM) Alcohol Abuse Father. Arthritis Mother. Depression Mother. Diabetes Mellitus Mother. Hypertension Mother.  Pregnancy / Birth  History Malachi Bonds, CMA; 10/01/2016 4:41 PM) Age at menarche 69 years. Contraceptive History Intrauterine device. Gravida 3 Length (months) of breastfeeding 12-24 Maternal age 58-30 Para 3    Review of Systems Malachi Bonds CMA; 10/01/2016 4:41 PM) General Present- Appetite Loss. Not Present- Chills, Fatigue, Fever, Night Sweats, Weight Gain and Weight Loss. Skin Present- Dryness. Not Present- Change in Wart/Mole, Hives, Jaundice, New Lesions, Non-Healing Wounds, Rash and Ulcer. HEENT Present- Seasonal Allergies. Not Present- Earache, Hearing Loss, Hoarseness, Nose Bleed, Oral Ulcers, Ringing in the Ears, Sinus Pain, Sore Throat, Visual Disturbances, Wears glasses/contact lenses and Yellow Eyes. Gastrointestinal Present- Abdominal Pain, Bloating, Constipation, Gets full quickly at meals, Indigestion, Nausea and Vomiting. Not Present- Bloody Stool, Change in Bowel Habits, Chronic diarrhea, Difficulty Swallowing, Excessive gas, Hemorrhoids and Rectal Pain. Musculoskeletal Present- Back Pain. Not Present- Joint Pain, Joint Stiffness, Muscle Pain, Muscle Weakness and Swelling of Extremities.  Addendum Note(Jennifermarie Franzen A. Korie Brabson MD; 10/01/2016 5:03 PM) Cardiac: no chest pain, no shortness of breath, no palpitations, no peripheral edema Resp: no cough, no shortness of breath, no productive cough, no wheezes Vasc: no foot lesions or discolorations, no leg pains, no history of stroke   Vitals (Chemira Jones CMA; 10/01/2016 3:41 PM) 10/01/2016 3:40 PM Weight: 208.4 lb Height: 67in Body Surface Area: 2.06 m Body Mass Index: 32.64 kg/m  Temp.: 98.45F  Pulse: 79 (Regular)  P.OX: 98% (Room air) BP: 138/90 (Sitting, Left Arm, Standard)       Physical Exam Lisa Spruce, MD; 10/01/2016 4:41 PM) General Mental Status-Alert. General Appearance-Cooperative. Orientation-Oriented X4. Posture-Normal posture.  Integumentary Global Assessment Normal Exam - Head/Face:  no rashes, ulcers, lesions or evidence of photo damage. No palpable nodules or masses and Neck: no visible lesions or palpable masses.  Head and Neck Head-normocephalic, atraumatic with no lesions or palpable masses. Face Global Assessment - atraumatic. Thyroid Gland Characteristics - normal size and consistency.  Eye Eyeball - Bilateral-Extraocular movements intact. Sclera/Conjunctiva - Bilateral-No scleral icterus, No Discharge.  ENMT Nose and Sinuses Nose - no deformities observed, no swelling present.  Chest and Lung Exam Palpation Normal exam - Non-tender. Auscultation Breath sounds - Normal.  Cardiovascular Auscultation Rhythm - Regular. Heart Sounds - S1 WNL and S2 WNL. Carotid arteries - No Carotid bruit.  Abdomen Inspection Normal Exam - No Visible peristalsis, No Abnormal pulsations and No Paradoxical movements. Palpation/Percussion Normal exam - Soft, No Rebound tenderness, No Rigidity (guarding), No hepatosplenomegaly and No Palpable abdominal masses. Tenderness - Right Upper Quadrant. Gallbladder - Negative Murphy's sign.  Peripheral Vascular Upper Extremity Palpation - Pulses bilaterally normal. Lower Extremity Palpation - Edema - Bilateral - No edema.  Neurologic Neurologic evaluation reveals -normal sensation and normal coordination.  Neuropsychiatric Mental status exam performed with findings of-able to articulate well with normal speech/language, rate, volume and coherence and thought content normal with ability to perform basic computations and apply abstract reasoning.  Musculoskeletal Normal Exam - Bilateral-Upper Extremity Strength Normal and Lower Extremity Strength Normal.    Assessment & Plan Lisa Spruce MD; 10/01/2016 4:42 PM) GALL BLADDER STONES (K80.20) Impression: 40 year old female with symptomatic gallstones concerning for acute on chronic cholecystitis. We discussed the etiology of gallstones and probably cause  pain. We discussed exacerbating factors including fatty meals. We discussed the details of surgery for removal of the gallbladder including general anesthesia, 4 small incisions in the patient's abdomen, removal of the patient's gallbladder with the liver and common bile duct, and most likely outpatient procedure. We discussed risks of common bile duct injury, cystic duct stump leak, injury to liver, bleeding, infection, need for open procedure, and post cholecystectomy syndrome. The patient showed good understanding and wanted to proceed with laparoscopic cholecystectomy. Current Plans The anatomy & physiology of hepatobiliary & pancreatic function was discussed. The pathophysiology of gallbladder dysfunction was discussed. Natural history risks without surgery was discussed. I feel the risks of no intervention will lead to serious problems that outweigh the operative risks; therefore, I recommended cholecystectomy to remove the pathology. I explained laparoscopic techniques with possible need for an open approach. Probable cholangiogram to evaluate the bilary tract was explained as well.  Risks such as bleeding, infection, abscess, leak, injury to other organs, need for further treatment, heart attack, death, and other risks were discussed. I noted a good likelihood this will help address the problem. Possibility that this will not correct all abdominal symptoms was explained. Goals of post-operative recovery were discussed as well. We will work to minimize complications. An educational handout further explaining the pathology and treatment options was given as well. Questions were answered. The patient expresses understanding & wishes to proceed with surgery.  Pt Education - Laparoscopic Cholecystectomy: gallbladder Pt Education - Pamphlet Given - Laparoscopic Gallbladder Surgery: discussed with patient and provided information. You are being scheduled for surgery - Our schedulers will call you.  You  should hear from our office's scheduling department within 5 working days about the location, date, and time of surgery. We try to make accommodations for patient's preferences in scheduling surgery, but sometimes the OR schedule or the surgeon's schedule prevents [place] from making those accommodations.  If you have not heard from our office [phone]) in 5 working days, call the office and ask for your surgeon's nurse.  If you have other  questions about your diagnosis, plan, or surgery, call the office and ask for your surgeon's nurse.

## 2016-10-05 NOTE — Anesthesia Preprocedure Evaluation (Addendum)
Anesthesia Evaluation  Patient identified by MRN, date of birth, ID band Patient awake    Reviewed: Allergy & Precautions, NPO status , Patient's Chart, lab work & pertinent test results  Airway Mallampati: I  TM Distance: >3 FB Neck ROM: Full    Dental  (+) Teeth Intact   Pulmonary    breath sounds clear to auscultation       Cardiovascular  Rhythm:Regular Rate:Normal     Neuro/Psych    GI/Hepatic   Endo/Other  Morbid obesity  Renal/GU      Musculoskeletal   Abdominal   Peds  Hematology   Anesthesia Other Findings   Reproductive/Obstetrics                            Anesthesia Physical Anesthesia Plan  ASA: I  Anesthesia Plan: General   Post-op Pain Management:    Induction: Intravenous  Airway Management Planned: Oral ETT  Additional Equipment:   Intra-op Plan:   Post-operative Plan: Extubation in OR  Informed Consent: I have reviewed the patients History and Physical, chart, labs and discussed the procedure including the risks, benefits and alternatives for the proposed anesthesia with the patient or authorized representative who has indicated his/her understanding and acceptance.   Dental advisory given  Plan Discussed with: CRNA  Anesthesia Plan Comments:        Anesthesia Quick Evaluation

## 2016-10-05 NOTE — Transfer of Care (Signed)
Immediate Anesthesia Transfer of Care Note  Patient: Lisa Hendrix  Procedure(s) Performed: Procedure(s): LAPAROSCOPIC CHOLECYSTECTOMY (N/A)  Patient Location: PACU  Anesthesia Type:General  Level of Consciousness: awake, alert  and oriented  Airway & Oxygen Therapy: Patient Spontanous Breathing and Patient connected to face mask oxygen  Post-op Assessment: Report given to RN, Post -op Vital signs reviewed and stable and Patient moving all extremities X 4  Post vital signs: Reviewed and stable  Last Vitals:  Vitals:   10/05/16 0944  BP: 133/62  Pulse: (!) 55  Resp: 16  Temp: 36.4 C    Last Pain:  Vitals:   10/05/16 0944  TempSrc: Oral      Patients Stated Pain Goal: 3 (38/18/29 [id number])  Complications: No apparent anesthesia complications

## 2016-10-06 ENCOUNTER — Encounter (HOSPITAL_COMMUNITY): Payer: Self-pay | Admitting: General Surgery

## 2016-10-07 NOTE — Anesthesia Postprocedure Evaluation (Signed)
Anesthesia Post Note  Patient: Lisa Hendrix  Procedure(s) Performed: Procedure(s) (LRB): LAPAROSCOPIC CHOLECYSTECTOMY (N/A)  Patient location during evaluation: PACU Anesthesia Type: General Level of consciousness: awake and alert Pain management: pain level controlled Vital Signs Assessment: post-procedure vital signs reviewed and stable Respiratory status: spontaneous breathing, nonlabored ventilation, respiratory function stable and patient connected to nasal cannula oxygen Cardiovascular status: blood pressure returned to baseline and stable Postop Assessment: no signs of nausea or vomiting Anesthetic complications: no    Last Vitals:  Vitals:   10/05/16 1520 10/05/16 1530  BP: 107/69 126/66  Pulse: 71 72  Resp: 15   Temp:  36.5 C    Last Pain:  Vitals:   10/05/16 1525  TempSrc:   PainSc: 4                  Jewel Venditto,JAMES TERRILL

## 2016-10-28 ENCOUNTER — Other Ambulatory Visit: Payer: Self-pay | Admitting: Physician Assistant

## 2016-10-28 NOTE — Telephone Encounter (Signed)
Rx request Denied, one-time medication pre-surgery; patient was given Ibuprofen/Motrin 800 mg & Hydrocodone-APAP from Surgeon post Cholecystectomy/SLS 11/29

## 2016-11-28 ENCOUNTER — Other Ambulatory Visit: Payer: Self-pay | Admitting: Physician Assistant

## 2016-11-28 DIAGNOSIS — G43709 Chronic migraine without aura, not intractable, without status migrainosus: Secondary | ICD-10-CM

## 2016-12-04 ENCOUNTER — Encounter: Payer: Self-pay | Admitting: Physician Assistant

## 2016-12-04 ENCOUNTER — Other Ambulatory Visit: Payer: Self-pay | Admitting: Emergency Medicine

## 2016-12-04 MED ORDER — SUMATRIPTAN SUCCINATE 25 MG PO TABS: ORAL_TABLET | ORAL | 0 refills | Status: DC

## 2017-01-06 ENCOUNTER — Other Ambulatory Visit: Payer: Self-pay | Admitting: Physician Assistant

## 2017-01-06 ENCOUNTER — Telehealth: Payer: Self-pay | Admitting: Physician Assistant

## 2017-01-06 DIAGNOSIS — D1800 Hemangioma unspecified site: Secondary | ICD-10-CM

## 2017-01-06 NOTE — Telephone Encounter (Signed)
Please call patient. She is due for repeat Liver US to reassess a hemangioma noted on imaging 3 months ago. She will be contacted to schedule.

## 2017-01-06 NOTE — Telephone Encounter (Signed)
-----   Message from Brunetta Jeans, PA-C sent at 09/30/2016  2:50 PM EDT ----- US abdomen -- liver hemangioma 3 months.

## 2017-01-07 ENCOUNTER — Encounter: Payer: Self-pay | Admitting: Emergency Medicine

## 2017-01-07 NOTE — Telephone Encounter (Signed)
My chart message sent to patient for reminder of repeat testing.

## 2017-02-09 ENCOUNTER — Other Ambulatory Visit: Payer: Self-pay | Admitting: Physician Assistant

## 2017-02-09 DIAGNOSIS — G43709 Chronic migraine without aura, not intractable, without status migrainosus: Secondary | ICD-10-CM

## 2017-03-04 ENCOUNTER — Encounter: Payer: Self-pay | Admitting: Physician Assistant

## 2017-04-03 ENCOUNTER — Other Ambulatory Visit: Payer: Self-pay | Admitting: Physician Assistant

## 2017-04-03 DIAGNOSIS — G43709 Chronic migraine without aura, not intractable, without status migrainosus: Secondary | ICD-10-CM

## 2017-05-11 ENCOUNTER — Ambulatory Visit (INDEPENDENT_AMBULATORY_CARE_PROVIDER_SITE_OTHER): Payer: 59 | Admitting: Physician Assistant

## 2017-05-11 ENCOUNTER — Encounter: Payer: Self-pay | Admitting: Physician Assistant

## 2017-05-11 VITALS — BP 118/70 | HR 58 | Temp 98.5°F | Resp 14 | Ht 67.0 in | Wt 212.0 lb

## 2017-05-11 DIAGNOSIS — IMO0002 Reserved for concepts with insufficient information to code with codable children: Secondary | ICD-10-CM

## 2017-05-11 DIAGNOSIS — G43709 Chronic migraine without aura, not intractable, without status migrainosus: Secondary | ICD-10-CM | POA: Diagnosis not present

## 2017-05-11 DIAGNOSIS — E669 Obesity, unspecified: Secondary | ICD-10-CM | POA: Diagnosis not present

## 2017-05-11 LAB — TSH: TSH: 1.48 mIU/L

## 2017-05-11 LAB — T4, FREE: Free T4: 1 ng/dL (ref 0.8–1.8)

## 2017-05-11 NOTE — Assessment & Plan Note (Signed)
Doing much better. Continue current regimen. Discussed proper timing of dosages.

## 2017-05-11 NOTE — Progress Notes (Signed)
Pre visit review using our clinic review tool, if applicable. No additional management support is needed unless otherwise documented below in the visit note. 

## 2017-05-11 NOTE — Assessment & Plan Note (Signed)
Repeat TSH and Free T4. Patient with symptoms of hypothyroidism. May consider treatment for sub-optimal levels within a normal range if present.

## 2017-05-11 NOTE — Progress Notes (Signed)
Patient presents to clinic today c/o for follow-up of chronic migraine. Patient is currently on a regimen of Topamax 25 mg BID. Is taking medications as directed. Endorses doing much better with this regimen. Notes occasional breakthrough headache occurring about once every month.. Denies side effect of medications.   Notes continued heavy weight despite calorie-restricted diet and exercise. Has had stable weight since November despite diet. Body mass index is 33.2 kg/m. Endorses cold intolerance, hair thinning and dry skin. Previously with constipation prior to gallbladder removal. Thyroid function has been within normal limits previously.   Past Medical History:  Diagnosis Date  . Depression    denies  . Family history of adverse reaction to anesthesia    Mother- difficulty awaken- 2016- 1st time ever  . Headache    MIgraines  . History of kidney stones 2000   passed  . Pleurisy     Current Outpatient Prescriptions on File Prior to Visit  Medication Sig Dispense Refill  . acyclovir (ZOVIRAX) 200 MG capsule Take 1 capsule (200 mg total) by mouth as needed. For fever blisters. 30 capsule 0  . levonorgestrel (MIRENA) 20 MCG/24HR IUD 1 each by Intrauterine route once.    . Probiotic Product (PROBIOTIC DAILY) CAPS Take 1 capsule by mouth daily. INSYNC    . SUMAtriptan (IMITREX) 25 MG tablet TAKE 1 TABLET BY MOUTH ONCE. MAY REPEAT IN 2 HOURS IF HEADACHE PERSISTS 10 tablet 0  . topiramate (TOPAMAX) 25 MG tablet Take 1 tablet (25 mg total) by mouth 2 (two) times daily. 60 tablet 3  . triamcinolone (NASACORT AQ) 55 MCG/ACT AERO nasal inhaler Place 2 sprays into the nose daily as needed. (Patient taking differently: Place 2 sprays into the nose daily as needed (allergies). ) 1 Inhaler 3   No current facility-administered medications on file prior to visit.     Allergies  Allergen Reactions  . Penicillins Swelling    SWELLING REACTION UNSPECIFIED   Has patient had a PCN reaction causing  immediate rash, facial/tongue/throat swelling, SOB or lightheadedness with hypotension: Yes Has patient had a PCN reaction causing severe rash involving mucus membranes or skin necrosis: No Has patient had a PCN reaction that required hospitalization Unknown Has patient had a PCN reaction occurring within the last 10 years: No If all of the above answers are "NO", then may proceed with Cephalosporin use.     Family History  Problem Relation Age of Onset  . Diabetes Mother   . Hyperlipidemia Mother   . Hypertension Mother   . Alcohol abuse Father   . Cancer Maternal Grandmother        breast Cancer  . Diabetes Maternal Grandmother   . Heart disease Maternal Grandfather   . Diabetes Maternal Grandfather   . Cancer Paternal Grandmother        Breast  . Cancer Maternal Aunt        Breast  . Cancer Paternal Aunt        breast    Social History   Social History  . Marital status: Married    Spouse name: N/A  . Number of children: N/A  . Years of education: N/A   Social History Main Topics  . Smoking status: Never Smoker  . Smokeless tobacco: Never Used  . Alcohol use No     Comment: socially  . Drug use: No  . Sexual activity: Not Asked   Other Topics Concern  . None   Social History Narrative  . None  Review of Systems - See HPI.  All other ROS are negative.  BP 118/70   Pulse (!) 58   Temp 98.5 F (36.9 C) (Oral)   Resp 14   Ht 5' 7"  (1.702 m)   Wt 212 lb (96.2 kg)   SpO2 98%   BMI 33.20 kg/m   Physical Exam  Constitutional: She is oriented to person, place, and time and well-developed, well-nourished, and in no distress.  HENT:  Head: Normocephalic and atraumatic.  Eyes: Conjunctivae are normal.  Neck: Neck supple. No thyromegaly present.  Cardiovascular: Normal rate, regular rhythm, normal heart sounds and intact distal pulses.   Pulmonary/Chest: Effort normal and breath sounds normal. No respiratory distress. She has no wheezes. She has no rales.  She exhibits no tenderness.  Neurological: She is alert and oriented to person, place, and time.  Skin: Skin is warm and dry. No rash noted.  Psychiatric: Affect normal.  Vitals reviewed.  Assessment/Plan: Chronic migraine Doing much better. Continue current regimen. Discussed proper timing of dosages.   Obesity (BMI 30-39.9) Repeat TSH and Free T4. Patient with symptoms of hypothyroidism. May consider treatment for sub-optimal levels within a normal range if present.     Leeanne Rio, PA-C

## 2017-05-11 NOTE — Patient Instructions (Signed)
Please continue Topamax twice daily. I would recommend taking second dose about an hour earlier each day. Continue imitrex as needed for acute headache.  I am rechecking thyroid function today to assess for change in levels. If at lower end of normal for your free thyroid, we may start trial of thyroid supplementation to help with symptoms including weight.

## 2017-05-12 ENCOUNTER — Encounter: Payer: Self-pay | Admitting: Physician Assistant

## 2017-05-12 MED ORDER — VALACYCLOVIR HCL 1 G PO TABS: ORAL_TABLET | ORAL | 0 refills | Status: DC

## 2017-05-13 ENCOUNTER — Other Ambulatory Visit: Payer: Self-pay | Admitting: Physician Assistant

## 2017-05-13 DIAGNOSIS — R635 Abnormal weight gain: Secondary | ICD-10-CM

## 2017-05-13 DIAGNOSIS — E669 Obesity, unspecified: Secondary | ICD-10-CM

## 2017-05-18 ENCOUNTER — Encounter: Payer: Self-pay | Admitting: Physician Assistant

## 2017-05-19 NOTE — Telephone Encounter (Signed)
See MyChart message.  Please check on status of Endo referral and reach out to patient either via phone or MyChart message.  Thank you!

## 2017-06-20 ENCOUNTER — Encounter: Payer: Self-pay | Admitting: Physician Assistant

## 2017-06-21 NOTE — Telephone Encounter (Signed)
Please reach out to Dr. Almetta Lovely office to see the status of the referral.  Please reach out to patient once we know something. Thank you!

## 2017-06-24 NOTE — Progress Notes (Signed)
Patient presents to clinic today to discuss referral to Endocrinology regarding symptoms of hypothyroidism despite normal lab values. The patient has had long-standing issues with fatigue, weight gain despite significant exercise regimen and good diet, dryness of skin and constipation. Workup has previously been unremarkable. TSH is within normal limits but free T4 has remained on the lower limit of normal. She has been referred to endocrinology for further assessment but cannot be seen for several months. Attempted to set patient up with our endocrinology division, But they declined to see patient.  Past Medical History:  Diagnosis Date  . Depression    denies  . Family history of adverse reaction to anesthesia    Mother- difficulty awaken- 2016- 1st time ever  . Headache    MIgraines  . History of kidney stones 2000   passed  . Pleurisy     Current Outpatient Prescriptions on File Prior to Visit  Medication Sig Dispense Refill  . levonorgestrel (MIRENA) 20 MCG/24HR IUD 1 each by Intrauterine route once.    . SUMAtriptan (IMITREX) 25 MG tablet TAKE 1 TABLET BY MOUTH ONCE. MAY REPEAT IN 2 HOURS IF HEADACHE PERSISTS 10 tablet 0  . topiramate (TOPAMAX) 25 MG tablet Take 1 tablet (25 mg total) by mouth 2 (two) times daily. 60 tablet 3  . triamcinolone (NASACORT AQ) 55 MCG/ACT AERO nasal inhaler Place 2 sprays into the nose daily as needed. (Patient taking differently: Place 2 sprays into the nose daily as needed (allergies). ) 1 Inhaler 3  . valACYclovir (VALTREX) 1000 MG tablet Take two tablets twice daily for 1 day 4 tablet 0   No current facility-administered medications on file prior to visit.     Allergies  Allergen Reactions  . Penicillins Swelling    SWELLING REACTION UNSPECIFIED   Has patient had a PCN reaction causing immediate rash, facial/tongue/throat swelling, SOB or lightheadedness with hypotension: Yes Has patient had a PCN reaction causing severe rash involving  mucus membranes or skin necrosis: No Has patient had a PCN reaction that required hospitalization Unknown Has patient had a PCN reaction occurring within the last 10 years: No If all of the above answers are "NO", then may proceed with Cephalosporin use.     Family History  Problem Relation Age of Onset  . Diabetes Mother   . Hyperlipidemia Mother   . Hypertension Mother   . Alcohol abuse Father   . Cancer Maternal Grandmother        breast Cancer  . Diabetes Maternal Grandmother   . Heart disease Maternal Grandfather   . Diabetes Maternal Grandfather   . Cancer Paternal Grandmother        Breast  . Cancer Maternal Aunt        Breast  . Cancer Paternal Aunt        breast    Social History   Social History  . Marital status: Married    Spouse name: N/A  . Number of children: N/A  . Years of education: N/A   Social History Main Topics  . Smoking status: Never Smoker  . Smokeless tobacco: Never Used  . Alcohol use No     Comment: socially  . Drug use: No  . Sexual activity: Not Asked   Other Topics Concern  . None   Social History Narrative  . None    Review of Systems - See HPI.  All other ROS are negative.  BP 118/72   Pulse 66   Temp 98.2  F (36.8 C) (Oral)   Resp 14   Ht 5' 7"  (1.702 m)   Wt 211 lb (95.7 kg)   SpO2 99%   BMI 33.05 kg/m   Physical Exam  Constitutional: She is oriented to person, place, and time and well-developed, well-nourished, and in no distress.  HENT:  Head: Normocephalic and atraumatic.  Eyes: Conjunctivae are normal.  Neck: Neck supple.  Cardiovascular: Normal rate, regular rhythm, normal heart sounds and intact distal pulses.   Pulmonary/Chest: Effort normal and breath sounds normal. No respiratory distress. She has no wheezes. She has no rales. She exhibits no tenderness.  Abdominal: Soft. Bowel sounds are normal.  Neurological: She is alert and oriented to person, place, and time.  Skin: Skin is warm and dry. No rash  noted.  Psychiatric: Affect normal.  Vitals reviewed.  Recent Results (from the past 2160 hour(s))  TSH     Status: None   Collection Time: 05/11/17  4:42 PM  Result Value Ref Range   TSH 1.48 mIU/L    Comment:   Reference Range   > or = 20 Years  0.40-4.50   Pregnancy Range First trimester  0.26-2.66 Second trimester 0.55-2.73 Third trimester  0.43-2.91     T4, free     Status: None   Collection Time: 05/11/17  4:42 PM  Result Value Ref Range   Free T4 1.0 0.8 - 1.8 ng/dL    Assessment/Plan: Thyroid condition Discussed case with Supervising MD, Dr. Birdie Riddle. Patient with the gambit of hypothyroid symptoms chronically and worsening. TSH normal but T4 low-end of normal. Will start 25 mcg levothyroxine to assess symptom response. Close follow-up scheduled for reassessment and repeat labs.     Leeanne Rio, PA-C

## 2017-06-25 ENCOUNTER — Ambulatory Visit (INDEPENDENT_AMBULATORY_CARE_PROVIDER_SITE_OTHER): Payer: 59 | Admitting: Physician Assistant

## 2017-06-25 ENCOUNTER — Encounter: Payer: Self-pay | Admitting: Physician Assistant

## 2017-06-25 VITALS — BP 118/72 | HR 66 | Temp 98.2°F | Resp 14 | Ht 67.0 in | Wt 211.0 lb

## 2017-06-25 DIAGNOSIS — E079 Disorder of thyroid, unspecified: Secondary | ICD-10-CM | POA: Insufficient documentation

## 2017-06-25 MED ORDER — LEVOTHYROXINE SODIUM 25 MCG PO TABS: 25.0000 ug | ORAL_TABLET | Freq: Every day | ORAL | 0 refills | Status: DC

## 2017-06-25 NOTE — Patient Instructions (Signed)
Please start medication as directed, taking first thing in the morning, at least 30 minutes prior to food or other medications.  Keep up with diet and exercise. Follow-up with me in 4 weeks for reassessment.  If not improving we will need to stop medication and seek specialist guidance.

## 2017-06-25 NOTE — Assessment & Plan Note (Signed)
Discussed case with Supervising MD, Dr. Birdie Riddle. Patient with the gambit of hypothyroid symptoms chronically and worsening. TSH normal but T4 low-end of normal. Will start 25 mcg levothyroxine to assess symptom response. Close follow-up scheduled for reassessment and repeat labs.

## 2017-06-25 NOTE — Progress Notes (Signed)
Pre visit review using our clinic review tool, if applicable. No additional management support is needed unless otherwise documented below in the visit note. 

## 2017-07-23 ENCOUNTER — Ambulatory Visit (INDEPENDENT_AMBULATORY_CARE_PROVIDER_SITE_OTHER): Payer: 59 | Admitting: Physician Assistant

## 2017-07-23 ENCOUNTER — Encounter: Payer: Self-pay | Admitting: Physician Assistant

## 2017-07-23 VITALS — BP 110/72 | HR 62 | Temp 98.1°F | Resp 14 | Ht 67.0 in | Wt 212.0 lb

## 2017-07-23 DIAGNOSIS — E079 Disorder of thyroid, unspecified: Secondary | ICD-10-CM | POA: Diagnosis not present

## 2017-07-23 NOTE — Progress Notes (Signed)
Pre visit review using our clinic review tool, if applicable. No additional management support is needed unless otherwise documented below in the visit note. 

## 2017-07-23 NOTE — Patient Instructions (Signed)
Please go to the lab for blood work. I will call with results and we will make further changes.  Keep taking the levothyroxine as directed.

## 2017-07-23 NOTE — Progress Notes (Signed)
Patient presents to clinic today for follow-up after initiation of very low-dose levothyroxine for symptoms of hypothyroidism. Patient is taking as directed without side effect. Is noting improvement in energy and stabilization of weight. Denies new or worsening symptoms. Is keeping up with diet and exercise regimen.   Past Medical History:  Diagnosis Date  . Depression    denies  . Family history of adverse reaction to anesthesia    Mother- difficulty awaken- 2016- 1st time ever  . Headache    MIgraines  . History of kidney stones 2000   passed  . Pleurisy     Current Outpatient Prescriptions on File Prior to Visit  Medication Sig Dispense Refill  . levonorgestrel (MIRENA) 20 MCG/24HR IUD 1 each by Intrauterine route once.    Marland Kitchen levothyroxine (SYNTHROID, LEVOTHROID) 25 MCG tablet Take 1 tablet (25 mcg total) by mouth daily before breakfast. 30 tablet 0  . SUMAtriptan (IMITREX) 25 MG tablet TAKE 1 TABLET BY MOUTH ONCE. MAY REPEAT IN 2 HOURS IF HEADACHE PERSISTS 10 tablet 0  . topiramate (TOPAMAX) 25 MG tablet Take 1 tablet (25 mg total) by mouth 2 (two) times daily. 60 tablet 3  . triamcinolone (NASACORT AQ) 55 MCG/ACT AERO nasal inhaler Place 2 sprays into the nose daily as needed. (Patient taking differently: Place 2 sprays into the nose daily as needed (allergies). ) 1 Inhaler 3  . valACYclovir (VALTREX) 1000 MG tablet Take two tablets twice daily for 1 day 4 tablet 0   No current facility-administered medications on file prior to visit.     Allergies  Allergen Reactions  . Penicillins Swelling    SWELLING REACTION UNSPECIFIED   Has patient had a PCN reaction causing immediate rash, facial/tongue/throat swelling, SOB or lightheadedness with hypotension: Yes Has patient had a PCN reaction causing severe rash involving mucus membranes or skin necrosis: No Has patient had a PCN reaction that required hospitalization Unknown Has patient had a PCN reaction occurring within the  last 10 years: No If all of the above answers are "NO", then may proceed with Cephalosporin use.     Family History  Problem Relation Age of Onset  . Diabetes Mother   . Hyperlipidemia Mother   . Hypertension Mother   . Alcohol abuse Father   . Cancer Maternal Grandmother        breast Cancer  . Diabetes Maternal Grandmother   . Heart disease Maternal Grandfather   . Diabetes Maternal Grandfather   . Cancer Paternal Grandmother        Breast  . Cancer Maternal Aunt        Breast  . Cancer Paternal Aunt        breast    Social History   Social History  . Marital status: Married    Spouse name: N/A  . Number of children: N/A  . Years of education: N/A   Social History Main Topics  . Smoking status: Never Smoker  . Smokeless tobacco: Never Used  . Alcohol use No     Comment: socially  . Drug use: No  . Sexual activity: Not Asked   Other Topics Concern  . None   Social History Narrative  . None    Review of Systems - See HPI.  All other ROS are negative.  BP 110/72   Pulse 62   Temp 98.1 F (36.7 C) (Oral)   Resp 14   Ht 5' 7"  (1.702 m)   Wt 212 lb (96.2 kg)  SpO2 99%   BMI 33.20 kg/m   Physical Exam  Constitutional: She is oriented to person, place, and time and well-developed, well-nourished, and in no distress.  HENT:  Head: Normocephalic and atraumatic.  Neck: No thyromegaly present.  Cardiovascular: Normal rate, regular rhythm, normal heart sounds and intact distal pulses.   Pulmonary/Chest: Effort normal and breath sounds normal. No respiratory distress. She has no wheezes. She has no rales. She exhibits no tenderness.  Neurological: She is alert and oriented to person, place, and time.  Skin: Skin is warm and dry. No rash noted.  Vitals reviewed.   Recent Results (from the past 2160 hour(s))  TSH     Status: None   Collection Time: 05/11/17  4:42 PM  Result Value Ref Range   TSH 1.48 mIU/L    Comment:   Reference Range   > or = 20  Years  0.40-4.50   Pregnancy Range First trimester  0.26-2.66 Second trimester 0.55-2.73 Third trimester  0.43-2.91     T4, free     Status: None   Collection Time: 05/11/17  4:42 PM  Result Value Ref Range   Free T4 1.0 0.8 - 1.8 ng/dL  T4, free     Status: None   Collection Time: 07/23/17  4:10 PM  Result Value Ref Range   Free T4 1.2 0.8 - 1.8 ng/dL  TSH     Status: None   Collection Time: 07/23/17  4:10 PM  Result Value Ref Range   TSH 0.75 mIU/L    Comment:   Reference Range   > or = 20 Years  0.40-4.50   Pregnancy Range First trimester  0.26-2.66 Second trimester 0.55-2.73 Third trimester  0.43-2.91       Assessment/Plan: Thyroid condition Symptoms resolving. Weight stabilized. Will recheck levels today.     Leeanne Rio, PA-C

## 2017-07-24 LAB — TSH: TSH: 0.75 m[IU]/L

## 2017-07-24 LAB — T4, FREE: FREE T4: 1.2 ng/dL (ref 0.8–1.8)

## 2017-07-24 NOTE — Assessment & Plan Note (Signed)
Symptoms resolving. Weight stabilized. Will recheck levels today.

## 2017-07-25 ENCOUNTER — Other Ambulatory Visit: Payer: Self-pay | Admitting: Physician Assistant

## 2017-07-26 ENCOUNTER — Other Ambulatory Visit: Payer: Self-pay | Admitting: Physician Assistant

## 2017-07-26 DIAGNOSIS — E079 Disorder of thyroid, unspecified: Secondary | ICD-10-CM

## 2017-07-26 MED ORDER — LEVOTHYROXINE SODIUM 25 MCG PO TABS: 25.0000 ug | ORAL_TABLET | Freq: Every day | ORAL | 2 refills | Status: DC

## 2017-07-26 MED ORDER — SUMATRIPTAN SUCCINATE 25 MG PO TABS: ORAL_TABLET | ORAL | 3 refills | Status: AC

## 2017-07-27 ENCOUNTER — Other Ambulatory Visit: Payer: Self-pay | Admitting: Physician Assistant

## 2017-08-03 ENCOUNTER — Other Ambulatory Visit: Payer: Self-pay | Admitting: Physician Assistant

## 2017-08-04 NOTE — Telephone Encounter (Signed)
Please advise of continuation of the medication.

## 2017-08-08 ENCOUNTER — Other Ambulatory Visit: Payer: Self-pay | Admitting: Physician Assistant

## 2017-11-15 ENCOUNTER — Other Ambulatory Visit: Payer: Self-pay | Admitting: Physician Assistant

## 2017-11-15 DIAGNOSIS — G43709 Chronic migraine without aura, not intractable, without status migrainosus: Secondary | ICD-10-CM

## 2017-11-26 ENCOUNTER — Encounter: Payer: Self-pay | Admitting: Emergency Medicine

## 2017-12-10 ENCOUNTER — Telehealth: Payer: 59 | Admitting: Family

## 2017-12-10 DIAGNOSIS — N390 Urinary tract infection, site not specified: Secondary | ICD-10-CM

## 2017-12-10 MED ORDER — NITROFURANTOIN MONOHYD MACRO 100 MG PO CAPS: 100.0000 mg | ORAL_CAPSULE | Freq: Two times a day (BID) | ORAL | 0 refills | Status: DC

## 2017-12-10 NOTE — Progress Notes (Signed)
Thank you for the details you included in the comment boxes. Those details are very helpful in determining the best course of treatment for you and help [place] to provide the best care.  We are sorry that you are not feeling well.  Here is how we plan to help!  Based on what you shared with me it looks like you most likely have a simple urinary tract infection.  A UTI (Urinary Tract Infection) is a bacterial infection of the bladder.  Most cases of urinary tract infections are simple to treat but a key part of your care is to encourage you to drink plenty of fluids and watch your symptoms carefully.  I have prescribed MacroBid 100 mg twice a day for 5 days.  Your symptoms should gradually improve. Call us if the burning in your urine worsens, you develop worsening fever, back pain or pelvic pain or if your symptoms do not resolve after completing the antibiotic.  Urinary tract infections can be prevented by drinking plenty of water to keep your body hydrated.  Also be sure when you wipe, wipe from front to back and don't hold it in!  If possible, empty your bladder every 4 hours.  Your e-visit answers were reviewed by a board certified advanced clinical practitioner to complete your personal care plan.  Depending on the condition, your plan could have included both over the counter or prescription medications.  If there is a problem please reply  once you have received a response from your provider.  Your safety is important to [place].  If you have drug allergies check your prescription carefully.    You can use MyChart to ask questions about today's visit, request a non-urgent call back, or ask for a work or school excuse for 24 hours related to this e-Visit. If it has been greater than 24 hours you will need to follow up with your provider, or enter a new e-Visit to address those concerns.   You will get an e-mail in the next two days asking about your experience.  I hope that your e-visit has been  valuable and will speed your recovery. Thank you for using e-visits.

## 2017-12-28 ENCOUNTER — Encounter: Payer: Self-pay | Admitting: Physician Assistant

## 2017-12-28 ENCOUNTER — Other Ambulatory Visit: Payer: Self-pay

## 2017-12-28 ENCOUNTER — Ambulatory Visit: Payer: BLUE CROSS/BLUE SHIELD | Admitting: Physician Assistant

## 2017-12-28 ENCOUNTER — Telehealth: Payer: Self-pay | Admitting: Physician Assistant

## 2017-12-28 VITALS — BP 120/80 | Temp 98.2°F | Resp 14 | Ht 67.0 in | Wt 215.0 lb

## 2017-12-28 DIAGNOSIS — N6320 Unspecified lump in the left breast, unspecified quadrant: Secondary | ICD-10-CM | POA: Diagnosis not present

## 2017-12-28 NOTE — Progress Notes (Signed)
Patient presents to clinic today c/o tenderness of L lateral breast x 1 week. Endorses small area of a mass/bump that is warm to touch and tender. Denies drainage from the area. Denies known trauma or injury. Denies rash of breast or axillary region. Denies fever, chills, malaise or fatigue.  Past Medical History:  Diagnosis Date  . Depression    denies  . Family history of adverse reaction to anesthesia    Mother- difficulty awaken- 2016- 1st time ever  . Headache    MIgraines  . History of kidney stones 2000   passed  . Pleurisy     Current Outpatient Medications on File Prior to Visit  Medication Sig Dispense Refill  . levonorgestrel (MIRENA) 20 MCG/24HR IUD 1 each by Intrauterine route once.    Marland Kitchen levothyroxine (SYNTHROID, LEVOTHROID) 25 MCG tablet Take 1 tablet (25 mcg total) by mouth daily before breakfast. 30 tablet 2  . SUMAtriptan (IMITREX) 25 MG tablet TAKE 1 TABLET BY MOUTH ONCE. MAY REPEAT IN 2 HOURS IF HEADACHE PERSISTS 10 tablet 3  . topiramate (TOPAMAX) 25 MG tablet TAKE 1 TABLET BY MOUTH TWICE A DAY 60 tablet 0  . triamcinolone (NASACORT AQ) 55 MCG/ACT AERO nasal inhaler Place 2 sprays into the nose daily as needed. (Patient taking differently: Place 2 sprays into the nose daily as needed (allergies). ) 1 Inhaler 3  . valACYclovir (VALTREX) 1000 MG tablet TAKE TWO TABLETS TWICE DAILY FOR 1 DAY 4 tablet 3   No current facility-administered medications on file prior to visit.     Allergies  Allergen Reactions  . Penicillins Swelling    SWELLING REACTION UNSPECIFIED   Has patient had a PCN reaction causing immediate rash, facial/tongue/throat swelling, SOB or lightheadedness with hypotension: Yes Has patient had a PCN reaction causing severe rash involving mucus membranes or skin necrosis: No Has patient had a PCN reaction that required hospitalization Unknown Has patient had a PCN reaction occurring within the last 10 years: No If all of the above answers are  "NO", then may proceed with Cephalosporin use.     Family History  Problem Relation Age of Onset  . Diabetes Mother   . Hyperlipidemia Mother   . Hypertension Mother   . Alcohol abuse Father   . Cancer Maternal Grandmother        breast Cancer  . Diabetes Maternal Grandmother   . Heart disease Maternal Grandfather   . Diabetes Maternal Grandfather   . Cancer Paternal Grandmother        Breast  . Cancer Maternal Aunt        Breast  . Cancer Paternal Aunt        breast    Social History   Socioeconomic History  . Marital status: Married    Spouse name: None  . Number of children: None  . Years of education: None  . Highest education level: None  Social Needs  . Financial resource strain: None  . Food insecurity - worry: None  . Food insecurity - inability: None  . Transportation needs - medical: None  . Transportation needs - non-medical: None  Occupational History  . None  Tobacco Use  . Smoking status: Never Smoker  . Smokeless tobacco: Never Used  Substance and Sexual Activity  . Alcohol use: No    Comment: socially  . Drug use: No  . Sexual activity: None  Other Topics Concern  . None  Social History Narrative  . None   Review of  Systems - See HPI.  All other ROS are negative.  BP 120/80   Temp 98.2 F (36.8 C) (Oral)   Resp 14   Ht 5' 7"  (1.702 m)   Wt 215 lb (97.5 kg)   BMI 33.67 kg/m   Physical Exam  Constitutional: She is oriented to person, place, and time and well-developed, well-nourished, and in no distress.  HENT:  Head: Normocephalic and atraumatic.  Eyes: Conjunctivae are normal.  Cardiovascular: Normal rate, regular rhythm, normal heart sounds and intact distal pulses.  Pulmonary/Chest: Effort normal and breath sounds normal. No respiratory distress. She has no wheezes. She has no rales. She exhibits no tenderness.  Chaperone present for breast examination.  Lymphadenopathy:       Head (right side): No submental, no submandibular,  no tonsillar, no preauricular, no posterior auricular and no occipital adenopathy present.       Head (left side): No submental, no submandibular, no tonsillar, no preauricular, no posterior auricular and no occipital adenopathy present.    She has no cervical adenopathy.  Neurological: She is alert and oriented to person, place, and time.  Vitals reviewed.  Assessment/Plan: 1. Mass of left breast Mildly tender, mobile 2 cm mass at 2:30 position. No other masses noted. No lymphadenopathy palpable on exam today. Start NSAID, limit caffeine. Will obtain mammogram and [place] to further assess.  - US BREAST COMPLETE UNI LEFT INC AXILLA; Future - MM Digital Diagnostic Bilat; Future   Leeanne Rio, PA-C

## 2017-12-28 NOTE — Patient Instructions (Signed)
Please keep well hydrated. Use Ibuprofen or Motrin for tenderness. Apply ice to the area to help with tenderness.  We are getting imaging to further assess.  If you note any new or worsening symptoms before imaging is complete, please call and let me know.

## 2017-12-28 NOTE — Telephone Encounter (Signed)
Spoke with patient and she states she has been having left breast pain. 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 She stopped wearing underwire bras and wearing sports bra. She did contact GYN and they are unable to get her in until April. Scheduled appointment today with PCP. She is agreeable.

## 2017-12-28 NOTE — Telephone Encounter (Signed)
Pt called in to request a call back for providers assistant. Pt says that her left breast is very tender. She would like to be advised from provider on if he should see her for concern?    Please call back [phone]

## 2017-12-29 ENCOUNTER — Ambulatory Visit
Admission: RE | Admit: 2017-12-29 | Discharge: 2017-12-29 | Disposition: A | Discharge status: Home / Self Care | Payer: BLUE CROSS/BLUE SHIELD | Source: Ambulatory Visit | Attending: Physician Assistant | Admitting: Physician Assistant

## 2017-12-29 ENCOUNTER — Other Ambulatory Visit: Payer: Self-pay | Admitting: Physician Assistant

## 2017-12-29 ENCOUNTER — Telehealth: Payer: Self-pay | Admitting: Physician Assistant

## 2017-12-29 DIAGNOSIS — N6320 Unspecified lump in the left breast, unspecified quadrant: Secondary | ICD-10-CM

## 2017-12-29 DIAGNOSIS — N6310 Unspecified lump in the right breast, unspecified quadrant: Secondary | ICD-10-CM

## 2017-12-29 DIAGNOSIS — N6489 Other specified disorders of breast: Secondary | ICD-10-CM

## 2017-12-29 DIAGNOSIS — N641 Fat necrosis of breast: Secondary | ICD-10-CM

## 2017-12-29 NOTE — Telephone Encounter (Signed)
Spoke with Lattie Haw at the [facility]. Patient had diagnostic mammogram and abnormal finiding on the right breast. While patient was in the office they wanted to order an [place] of right breast as well.  Gave verbal order for the order. Just need signature of order by PCP.

## 2017-12-29 NOTE — Telephone Encounter (Signed)
Copied from [place]. Topic: Quick Communication - See Telephone Encounter >> Dec 29, 2017 12:04 PM Burnis Medin, NT wrote: CRM for notification. See Telephone encounter for: Lattie Haw is calling to see if she can get verbal orders for a right breast ultra sound. Patient is at the office now and  doctor saw something on the right breast. Pls call [phone]  12/29/17.

## 2017-12-29 NOTE — Telephone Encounter (Signed)
Signed.

## 2018-02-28 ENCOUNTER — Other Ambulatory Visit: Payer: Self-pay | Admitting: Physician Assistant

## 2018-02-28 DIAGNOSIS — G43709 Chronic migraine without aura, not intractable, without status migrainosus: Secondary | ICD-10-CM

## 2018-05-22 ENCOUNTER — Other Ambulatory Visit: Payer: Self-pay | Admitting: Physician Assistant

## 2018-05-23 ENCOUNTER — Encounter: Payer: Self-pay | Admitting: Emergency Medicine

## 2018-06-09 ENCOUNTER — Other Ambulatory Visit: Payer: Self-pay | Admitting: Physician Assistant

## 2018-06-09 NOTE — Telephone Encounter (Signed)
Patient is due for repeat thyroid testing. Actually is due for a CPE. Needs to schedule

## 2018-06-10 ENCOUNTER — Other Ambulatory Visit: Payer: Self-pay | Admitting: Physician Assistant

## 2018-06-13 ENCOUNTER — Encounter: Payer: Self-pay | Admitting: Podiatry

## 2018-06-13 ENCOUNTER — Ambulatory Visit: Payer: BLUE CROSS/BLUE SHIELD | Admitting: Podiatry

## 2018-06-13 VITALS — Temp 98.2°F | Resp 18

## 2018-06-13 DIAGNOSIS — L600 Ingrowing nail: Secondary | ICD-10-CM

## 2018-06-13 DIAGNOSIS — L089 Local infection of the skin and subcutaneous tissue, unspecified: Secondary | ICD-10-CM

## 2018-06-13 MED ORDER — SULFAMETHOXAZOLE-TRIMETHOPRIM 800-160 MG PO TABS: 1.0000 | ORAL_TABLET | Freq: Two times a day (BID) | ORAL | 0 refills | Status: DC

## 2018-06-13 NOTE — Progress Notes (Signed)
Subjective:    Patient ID: Lisa Hendrix, female    DOB: January 05, 1976, 42 y.o.   MRN: [id number]  HPI  42 year old female presents the office today for concerns of her left second toe becoming red and painful around the toenail which started on Friday.  She states that it started to tingle and she notes over the weekend increasing redness come from the area as well as pain on the toenail.  Denies any red streaks.  She has not seen any pus.  She got a pedicure last about 2 to 3 weeks ago.  Denies any recent injury.  No other concerns.  Review of Systems  All other systems reviewed and are negative.  Past Medical History:  Diagnosis Date  . Depression    denies  . Family history of adverse reaction to anesthesia    Mother- difficulty awaken- 2016- 1st time ever  . Headache    MIgraines  . History of kidney stones 2000   passed  . Pleurisy     Past Surgical History:  Procedure Laterality Date  . CHOLECYSTECTOMY N/A 10/05/2016   Procedure: LAPAROSCOPIC CHOLECYSTECTOMY;  Surgeon: Arta Bruce Kinsinger, MD;  Location: [facility];  Service: General;  Laterality: N/A;  . KNEE ARTHROSCOPY Left 2014   MCL repair  . TUMOR REMOVAL  01/2008   spine  . WISDOM TOOTH EXTRACTION  1995     Current Outpatient Medications:  .  levonorgestrel (MIRENA) 20 MCG/24HR IUD, 1 each by Intrauterine route once., Disp: , Rfl:  .  levothyroxine (SYNTHROID, LEVOTHROID) 25 MCG tablet, Take 1 tablet (25 mcg total) by mouth daily before breakfast., Disp: 30 tablet, Rfl: 2 .  levothyroxine (SYNTHROID, LEVOTHROID) 25 MCG tablet, TAKE 1 TABLET BY MOUTH EVERY DAY BEFORE BREAKFAST, Disp: 30 tablet, Rfl: 0 .  sulfamethoxazole-trimethoprim (BACTRIM DS,SEPTRA DS) 800-160 MG tablet, Take 1 tablet by mouth 2 (two) times daily., Disp: 20 tablet, Rfl: 0 .  SUMAtriptan (IMITREX) 25 MG tablet, TAKE 1 TABLET BY MOUTH ONCE. MAY REPEAT IN 2 HOURS IF HEADACHE PERSISTS, Disp: 10 tablet, Rfl: 3 .  topiramate (TOPAMAX) 25 MG  tablet, TAKE 1 TABLET BY MOUTH TWICE A DAY, Disp: 60 tablet, Rfl: 3 .  triamcinolone (NASACORT AQ) 55 MCG/ACT AERO nasal inhaler, Place 2 sprays into the nose daily as needed. (Patient taking differently: Place 2 sprays into the nose daily as needed (allergies). ), Disp: 1 Inhaler, Rfl: 3 .  valACYclovir (VALTREX) 1000 MG tablet, TAKE TWO TABLETS TWICE DAILY FOR 1 DAY, Disp: 4 tablet, Rfl: 3  Allergies  Allergen Reactions  . Penicillins Swelling    SWELLING REACTION UNSPECIFIED   Has patient had a PCN reaction causing immediate rash, facial/tongue/throat swelling, SOB or lightheadedness with hypotension: Yes Has patient had a PCN reaction causing severe rash involving mucus membranes or skin necrosis: No Has patient had a PCN reaction that required hospitalization Unknown Has patient had a PCN reaction occurring within the last 10 years: No If all of the above answers are "NO", then may proceed with Cephalosporin use.          Objective:   Physical Exam  General: AAO x3, NAD  Dermatological: Left second digit has erythema to the distal portion of the nail from the DIPJ distally and there is a small amount of purulence identified from the toenail itself more along the lateral corner.  There is no ascending sialitis.  No fluctuation or crepitation.  There is no malodor.  There is tenderness to the  long toenail mostly on the proximal nail corner as well as the lateral nail corner.  In general her right toenails are somewhat discolored as well as some dry skin and this all started on the right foot after she had bleach dropped on her foot this is been a chronic issue on the right foot not causing any new issues.  Vascular: Dorsalis Pedis artery and Posterior Tibial artery pedal pulses are 2/4 bilateral with immedate capillary fill time.  There is no pain with calf compression, swelling, warmth, erythema.   Neruologic: Grossly intact via light touch bilateral. Protective threshold with Semmes  Wienstein monofilament intact to all pedal sites bilateral.   Musculoskeletal: Tenderness palpation to the left second digit toenail.  No other areas of tenderness.. No pain, crepitus, or limitation noted with foot and ankle range of motion bilateral. Muscular strength 5/5 in all groups tested bilateral.  Gait: Unassisted, Nonantalgic.     Assessment & Plan:  Left second digit toenail infection, cellulitis toe. -Treatment options discussed including all alternatives, risks, and complications -Etiology of symptoms were discussed -Bactrim prescribed -At this time, recommended total nail removal without chemical matricectomy to the left 2nd digit toenails due to infection. Risks and complications were discussed with the patient for which they understand and  verbally consent to the procedure. Under sterile conditions a total of 3 mL of a mixture of 2% lidocaine plain and 0.5% Marcaine plain was infiltrated in a digital block fashion. Once anesthetized, the skin was prepped in sterile fashion. A tourniquet was then applied. Next the left 2nd digit nail border was excised making sure to remove the entire offending nail border. Once the nail was  Removed, the area was debrided and the underlying skin was intact. The area was irrigated and hemostasis was obtained.  A dry sterile dressing was applied. After application of the dressing the tourniquet was removed and there is found to be an immediate capillary refill time to the digit. The patient tolerated the procedure well any complications. Post procedure instructions were discussed the patient for which he verbally understood. Follow-up in one week for nail check or sooner if any problems are to arise. Discussed signs/symptoms of worsening infection and directed to call the office immediately should any occur or go directly to the emergency room. In the meantime, encouraged to call the office with any questions, concerns, changes symptoms.  Return in about 1  week (around 06/20/2018) for nail check.   Trula Slade DPM

## 2018-06-13 NOTE — Patient Instructions (Signed)

## 2018-06-19 ENCOUNTER — Encounter: Payer: Self-pay | Admitting: Physician Assistant

## 2018-06-20 ENCOUNTER — Ambulatory Visit (INDEPENDENT_AMBULATORY_CARE_PROVIDER_SITE_OTHER): Payer: Self-pay

## 2018-06-20 DIAGNOSIS — L600 Ingrowing nail: Secondary | ICD-10-CM

## 2018-06-21 NOTE — Addendum Note (Signed)
Addended by: Roney Jaffe on: 06/21/2018 04:13 PM   Modules accepted: Level of Service

## 2018-06-21 NOTE — Progress Notes (Signed)
Patient presents s/p left 2nd toe nail avulsion DOS 7.15.19. She says she feels like her toe is healing well, but it is still a little sore at times.   Noted well healing surgical area, with scab formation. No erythema, no redness, no swelling, no drainage. I did advise her to soak for a few more days to help with the soreness, neosporin as needed to area as well.   Follow up if symptom worsen or with any questions or concerns

## 2018-06-29 ENCOUNTER — Other Ambulatory Visit: Payer: BLUE CROSS/BLUE SHIELD

## 2018-06-29 ENCOUNTER — Telehealth: Payer: Self-pay | Admitting: Physician Assistant

## 2018-06-29 ENCOUNTER — Inpatient Hospital Stay: Admission: RE | Admit: 2018-06-29 | Payer: BLUE CROSS/BLUE SHIELD | Source: Ambulatory Visit

## 2018-06-29 DIAGNOSIS — R928 Other abnormal and inconclusive findings on diagnostic imaging of breast: Secondary | ICD-10-CM

## 2018-06-29 NOTE — Telephone Encounter (Signed)
Called pt and left a detailed message to advise per DPR.

## 2018-06-29 NOTE — Telephone Encounter (Signed)
Patient due for her repeat diagnostic mammogram as recommended by radiology due to abnormal but likely benign breast changes noted on imaging 6 months ago.  Order has been placed. Please let patient know she will be contacted to schedule.

## 2018-07-15 DIAGNOSIS — M7711 Lateral epicondylitis, right elbow: Secondary | ICD-10-CM | POA: Diagnosis not present

## 2018-11-17 ENCOUNTER — Telehealth: Payer: BLUE CROSS/BLUE SHIELD | Admitting: Family Medicine

## 2018-11-17 DIAGNOSIS — J40 Bronchitis, not specified as acute or chronic: Secondary | ICD-10-CM

## 2018-11-17 MED ORDER — BENZONATATE 100 MG PO CAPS: 100.0000 mg | ORAL_CAPSULE | Freq: Three times a day (TID) | ORAL | 0 refills | Status: DC | PRN

## 2018-11-17 MED ORDER — DOXYCYCLINE HYCLATE 100 MG PO TABS: 100.0000 mg | ORAL_TABLET | Freq: Two times a day (BID) | ORAL | 0 refills | Status: AC

## 2018-11-17 MED ORDER — PREDNISONE 20 MG PO TABS: 20.0000 mg | ORAL_TABLET | Freq: Every day | ORAL | 0 refills | Status: AC

## 2018-11-17 NOTE — Progress Notes (Signed)
We are sorry that you are not feeling well.  Here is how we plan to help!  Based on your presentation I believe you most likely have A cough due to bacteria.  When patients have a fever and a productive cough with a change in color or increased sputum production, we are concerned about bacterial bronchitis.  If left untreated it can progress to pneumonia.  If your symptoms do not improve with your treatment plan it is important that you contact your provider.   I have prescribed Doxycycline 100 mg twice a day for 7 days     In addition you may use A prescription cough medication called Tessalon Perles 138m. You may take 1-2 capsules every 8 hours as needed for your cough.  Prednisone 20 mg daily for 5 days each morning with breakfast   From your responses in the eVisit questionnaire you describe inflammation in the upper respiratory tract which is causing a significant cough.  This is commonly called Bronchitis and has four common causes:    Allergies  Viral Infections  Acid Reflux  Bacterial Infection Allergies, viruses and acid reflux are treated by controlling symptoms or eliminating the cause. An example might be a cough caused by taking certain blood pressure medications. You stop the cough by changing the medication. Another example might be a cough caused by acid reflux. Controlling the reflux helps control the cough.  USE OF BRONCHODILATOR ("RESCUE") INHALERS: There is a risk from using your bronchodilator too frequently.  The risk is that over-reliance on a medication which only relaxes the muscles surrounding the breathing tubes can reduce the effectiveness of medications prescribed to reduce swelling and congestion of the tubes themselves.  Although you feel brief relief from the bronchodilator inhaler, your asthma may actually be worsening with the tubes becoming more swollen and filled with mucus.  This can delay other crucial treatments, such as oral steroid medications. If you  need to use a bronchodilator inhaler daily, several times per day, you should discuss this with your provider.  There are probably better treatments that could be used to keep your asthma under control.     HOME CARE . Only take medications as instructed by your medical team. . Complete the entire course of an antibiotic. . Drink plenty of fluids and get plenty of rest. . Avoid close contacts especially the very young and the elderly . Cover your mouth if you cough or cough into your sleeve. . Always remember to wash your hands . A steam or ultrasonic humidifier can help congestion.   GET HELP RIGHT AWAY IF: . You develop worsening fever. . You become short of breath . You cough up blood. . Your symptoms persist after you have completed your treatment plan MAKE SURE YOU   Understand these instructions.  Will watch your condition.  Will get help right away if you are not doing well or get worse.  Your e-visit answers were reviewed by a board certified advanced clinical practitioner to complete your personal care plan.  Depending on the condition, your plan could have included both over the counter or prescription medications. If there is a problem please reply  once you have received a response from your provider. Your safety is important to [place]  If you have drug allergies check your prescription carefully.    You can use MyChart to ask questions about today's visit, request a non-urgent call back, or ask for a work or school excuse for 24 hours related  to this e-Visit. If it has been greater than 24 hours you will need to follow up with your provider, or enter a new e-Visit to address those concerns. You will get an e-mail in the next two days asking about your experience.  I hope that your e-visit has been valuable and will speed your recovery. Thank you for using e-visits.

## 2018-11-28 ENCOUNTER — Encounter: Payer: Self-pay | Admitting: Physician Assistant

## 2018-11-28 MED ORDER — VALACYCLOVIR HCL 1 G PO TABS: ORAL_TABLET | ORAL | 3 refills | Status: DC

## 2019-01-11 ENCOUNTER — Other Ambulatory Visit: Payer: Self-pay | Admitting: Emergency Medicine

## 2019-01-11 ENCOUNTER — Encounter: Payer: Self-pay | Admitting: Physician Assistant

## 2019-01-11 DIAGNOSIS — B001 Herpesviral vesicular dermatitis: Secondary | ICD-10-CM

## 2019-01-11 MED ORDER — VALACYCLOVIR HCL 1 G PO TABS: ORAL_TABLET | ORAL | 1 refills | Status: AC

## 2019-02-23 ENCOUNTER — Encounter: Payer: Self-pay | Admitting: Emergency Medicine

## 2019-06-01 ENCOUNTER — Telehealth: Payer: BLUE CROSS/BLUE SHIELD | Admitting: Family

## 2019-06-01 DIAGNOSIS — H60331 Swimmer's ear, right ear: Secondary | ICD-10-CM | POA: Diagnosis not present

## 2019-06-01 MED ORDER — NEOMYCIN-POLYMYXIN-HC 3.5-10000-1 OT SOLN: 4.0000 [drp] | Freq: Four times a day (QID) | OTIC | 0 refills | Status: DC

## 2019-06-01 NOTE — Progress Notes (Signed)
E Visit for Swimmer's Ear  We are sorry that you are not feeling well. Here is how we plan to help!  I have prescribed: Neomycin 0.35%, polymyxin B 10,000 units/mL, and hydrocortisone 0,5% otic solution 4 drops in affected ears four times a day for 7 days   In certain cases swimmer's ear may progress to a more serious bacterial infection of the middle or inner ear.  If you have a fever 102 and up and significantly worsening symptoms, this could indicate a more serious infection moving to the middle/inner and needs face to face evaluation in an office by a provider.  Your symptoms should improve over the next 3 days and should resolve in about 7 days.  HOME CARE:   Wash your hands frequently.  Do not place the tip of the bottle on your ear or touch it with your fingers.  You can take Acetominophen 650 mg every 4-6 hours as needed for pain.  If pain is severe or moderate, you can apply a heating pad (set on low) or hot water bottle (wrapped in a towel) to outer ear for 20 minutes.  This will also increase drainage.  Avoid ear plugs  Do not use Q-tips  After showers, help the water run out by tilting your head to one side.  GET HELP RIGHT AWAY IF:   Fever is over 102.2 degrees.  You develop progressive ear pain or hearing loss.  Ear symptoms persist longer than 3 days after treatment.  MAKE SURE YOU:   Understand these instructions.  Will watch your condition.  Will get help right away if you are not doing well or get worse.  TO PREVENT SWIMMER'S EAR:  Use a bathing cap or custom fitted swim molds to keep your ears dry.  Towel off after swimming to dry your ears.  Tilt your head or pull your earlobes to allow the water to escape your ear canal.  If there is still water in your ears, consider using a hairdryer on the lowest setting.  Thank you for choosing an e-visit. Your e-visit answers were reviewed by a board certified advanced clinical practitioner to complete  your personal care plan. Depending upon the condition, your plan could have included both over the counter or prescription medications. Please review your pharmacy choice. Be sure that the pharmacy you have chosen is open so that you can pick up your prescription now.  If there is a problem you may message your provider in [place] to have the prescription routed to another pharmacy. Your safety is important to [place]. If you have drug allergies check your prescription carefully.  For the next 24 hours, you can use MyChart to ask questions about today's visit, request a non-urgent call back, or ask for a work or school excuse from your e-visit provider. You will get an email in the next two days asking about your experience. I hope that your e-visit has been valuable and will speed your recovery.    Greater than 5 minutes, yet less than 10 minutes of time have been spent researching, coordinating, and implementing care for this patient today.  Thank you for the details you included in the comment boxes. Those details are very helpful in determining the best course of treatment for you and help [place] to provide the best care.

## 2019-06-23 DIAGNOSIS — S335XXA Sprain of ligaments of lumbar spine, initial encounter: Secondary | ICD-10-CM | POA: Diagnosis not present

## 2019-08-15 ENCOUNTER — Encounter: Payer: Self-pay | Admitting: Physician Assistant

## 2019-08-15 ENCOUNTER — Other Ambulatory Visit: Payer: Self-pay

## 2019-08-15 ENCOUNTER — Encounter: Payer: Self-pay | Admitting: Family Medicine

## 2019-08-15 ENCOUNTER — Ambulatory Visit (INDEPENDENT_AMBULATORY_CARE_PROVIDER_SITE_OTHER): Payer: BC Managed Care – PPO | Admitting: Family Medicine

## 2019-08-15 VITALS — Temp 98.6°F | Wt 221.0 lb

## 2019-08-15 DIAGNOSIS — R197 Diarrhea, unspecified: Secondary | ICD-10-CM | POA: Diagnosis not present

## 2019-08-15 DIAGNOSIS — R52 Pain, unspecified: Secondary | ICD-10-CM

## 2019-08-15 DIAGNOSIS — R059 Cough, unspecified: Secondary | ICD-10-CM

## 2019-08-15 DIAGNOSIS — R05 Cough: Secondary | ICD-10-CM

## 2019-08-15 NOTE — Patient Instructions (Addendum)
Follow up: if any worsening, new symptoms or if you are not improving over the next several days.  Get the testing as planned.   Can use aleve or tylenol as needed for aches and pains.  Can use imodium if any further diarrhea.  I hope you are feeling better soon! Seek care promptly if your symptoms worsen, new concerns arise or you are not improving with treatment.   Self Isolation/Home Quarantine: -see the CDC site for information:   [email or web address].co.za.html   -STAY HOME except for to seek medical care -stay in your own room away from others in your house and use a separate bathroom if possible -Wash hands frequently, wear a mask if you leave your room and interact as little as possible with others -seek medical care immediately if worsening - call our office for a visit or call ahead if going elsewhere to an urgent care  -seek emergency care if very sick or severe symptoms - call 911 -isolate for at least 10 days from the onset of symptoms PLUS 3 days of no fever PLUS 3 days of improving symptoms    Novel Coronavirus Testing:  Positive test. These tests are not 100% perfect, but if you tested positive for COVID-19, this confirms that you have contracted the SARS-CoV-2 virus. This should be reported to the local health department. STAY HOME to complete full Quarantine per CDC guidelines.  Negative test. These tests are not 100% perfect but if you tested negative for COVID-19, this indicates that you may not have contracted the SARS-CoV-2 virus. Follow your doctor's recommendations and the CDC guidelines.

## 2019-08-15 NOTE — Progress Notes (Signed)
Virtual Visit via Video Note  I connected with Lisa Hendrix  on 08/15/19 at  4:15 PM EDT by a video enabled telemedicine application and verified that I am speaking with the correct person using two identifiers.  Location patient: work Environmental manager or home office Persons participating in the virtual visit: patient, provider  I discussed the limitations of evaluation and management by telemedicine and the availability of in person appointments. The patient expressed understanding and agreed to proceed.   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  Past Medical History:  Diagnosis Date  . Depression    denies  . Family history of adverse reaction to anesthesia    Mother- difficulty awaken- 2016- 1st time ever  . Headache    MIgraines  . History of kidney stones 2000   passed  . Pleurisy     Past Surgical History:  Procedure Laterality Date  . CHOLECYSTECTOMY N/A 10/05/2016   Procedure: LAPAROSCOPIC CHOLECYSTECTOMY;  Surgeon: Arta Bruce Kinsinger, MD;  Location: [facility];  Service: General;  Laterality: N/A;  . KNEE ARTHROSCOPY Left 2014   MCL repair  . TUMOR REMOVAL  01/2008   spine  . WISDOM  TOOTH EXTRACTION  1995    Family History  Problem Relation Age of Onset  . Diabetes Mother   . Hyperlipidemia Mother   . Hypertension Mother   . Alcohol abuse Father   . Cancer Maternal Grandmother        breast Cancer  . Diabetes Maternal Grandmother   . Heart disease Maternal Grandfather   . Diabetes Maternal Grandfather   . Cancer Paternal Grandmother        Breast  . Breast cancer Paternal Grandmother   . Cancer Maternal Aunt        Breast  . Cancer Paternal Aunt        breast    SOCIAL HX: see hpi   Current Outpatient Medications:  .  benzonatate (TESSALON) 100 MG capsule, Take 1-2 capsules (100-200 mg total) by mouth 3 (three) times daily as needed for cough., Disp: 30 capsule, Rfl: 0 .  levonorgestrel (MIRENA) 20 MCG/24HR IUD, 1 each by Intrauterine route once., Disp: , Rfl:  .  levothyroxine (SYNTHROID, LEVOTHROID) 25 MCG tablet, Take 1 tablet (25 mcg total) by mouth daily before breakfast., Disp: 30 tablet, Rfl: 2 .  levothyroxine (SYNTHROID, LEVOTHROID) 25 MCG tablet, Take 1 tablet (25 mcg total) by mouth daily before breakfast. Last refill. Please contact office to schedule appointment, Disp: 15 tablet, Rfl: 0 .  neomycin-polymyxin-hydrocortisone (CORTISPORIN) OTIC solution, Place 4 drops into the right ear 4 (four) times daily., Disp: 10 mL, Rfl: 0 .  SUMAtriptan (IMITREX) 25 MG tablet,  TAKE 1 TABLET BY MOUTH ONCE. MAY REPEAT IN 2 HOURS IF HEADACHE PERSISTS, Disp: 10 tablet, Rfl: 3 .  topiramate (TOPAMAX) 25 MG tablet, TAKE 1 TABLET BY MOUTH TWICE A DAY, Disp: 60 tablet, Rfl: 3 .  triamcinolone (NASACORT AQ) 55 MCG/ACT AERO nasal inhaler, Place 2 sprays into the nose daily as needed. (Patient taking differently: Place 2 sprays into the nose daily as needed (allergies). ), Disp: 1 Inhaler, Rfl: 3 .  valACYclovir (VALTREX) 1000 MG tablet, TAKE TWO TABLETS TWICE DAILY FOR 1 DAY, Disp: 4 tablet, Rfl: 1  EXAM:  VITALS per patient if applicable: T 37.0  GENERAL: alert,  oriented, appears well and in no acute distress  HEENT: atraumatic, conjunttiva clear, no obvious abnormalities on inspection of external nose and ears, no nasal drainage, on video exam of the oropharynx she has moist mucus membranes, mild post oropharyngeal erythema - no tonsillar exudate or hypertrophy  NECK: normal movements of the head and neck  LUNGS: on inspection no signs of respiratory distress, breathing rate appears normal, no obvious gross SOB, gasping or wheezing  CV: no obvious cyanosis  MS: moves all visible extremities without noticeable abnormality  PSYCH/NEURO: pleasant and cooperative, no obvious depression or anxiety, speech and thought processing grossly intact  ASSESSMENT AND PLAN:  Discussed the following assessment and plan:  Diarrhea, unspecified type  Cough  Body aches  -we discussed possible serious and likely etiologies, options for evaluation and workup, limitations of telemedicine visit vs in person visit, treatment, treatment risks and precautions. Pt prefers to treat via telemedicine empirically rather then risking or undertaking an in person visit at this moment. Suspect viral illness or COVID19 most likely vs other. She agrees to home isolation and [facility]. Discussed limitations and risks for false negative results with COVID19 testing. Discussed symptomatic care with OTC analgesics, imodium also Vit D and C.  Patient agrees to seek prompt re-evaluation or in person care if worsening, new symptoms arise, or if is not improving with treatment.   I discussed the assessment and treatment plan with the patient. The patient was provided an opportunity to ask questions and all were answered. The patient agreed with the plan and demonstrated an understanding of the instructions.   The patient was advised to call back or seek an in-person evaluation if the symptoms worsen or if the condition fails to improve as  anticipated.   Lisa Kern, DO   Patient Instructions  Follow up: if any worsening, new symptoms or if you are not improving over the next several days.  Get the testing as planned.   Can use aleve or tylenol as needed for aches and pains.  Can use imodium if any further diarrhea.  I hope you are feeling better soon! Seek care promptly if your symptoms worsen, new concerns arise or you are not improving with treatment.   Self Isolation/Home Quarantine: -see the CDC site for information:   RunningShows.co.za.html   -STAY HOME except for to seek medical care -stay in your own room away from others in your house and use a separate bathroom if possible -Wash hands frequently, wear a mask if you leave your room and interact as little as possible with others -seek medical care immediately if worsening - call our office for a visit or call ahead if going elsewhere to an urgent care  -seek emergency care if very sick or severe symptoms - call 911 -isolate for at least 10 days  from the onset of symptoms PLUS 3 days of no fever PLUS 3 days of improving symptoms    Novel Coronavirus Testing:  Positive test. These tests are not 100% perfect, but if you tested positive for COVID-19, this confirms that you have contracted the SARS-CoV-2 virus. This should be reported to the local health department. STAY HOME to complete full Quarantine per CDC guidelines.  Negative test. These tests are not 100% perfect but if you tested negative for COVID-19, this indicates that you may not have contracted the SARS-CoV-2 virus. Follow your doctor's recommendations and the CDC guidelines.

## 2019-08-16 DIAGNOSIS — Z20828 Contact with and (suspected) exposure to other viral communicable diseases: Secondary | ICD-10-CM | POA: Diagnosis not present

## 2019-12-02 IMAGING — MG 2D DIGITAL DIAGNOSTIC BILATERAL MAMMOGRAM WITH CAD AND ADJUNCT T
8 of 18 series · 8 of 40 positions shown · non-contrast
Comparison: Previous exam(s).

CLINICAL DATA: 42-year-old female with focal left breast pain and
palpable abnormality. The patient reports trauma with significant
bruising to the outer left breast and left upper extremity late
[REDACTED].

EXAM:
2D DIGITAL DIAGNOSTIC BILATERAL MAMMOGRAM WITH CAD AND ADJUNCT TOMO
BILATERAL BREAST ULTRASOUND

[R CC (1 of 2)]
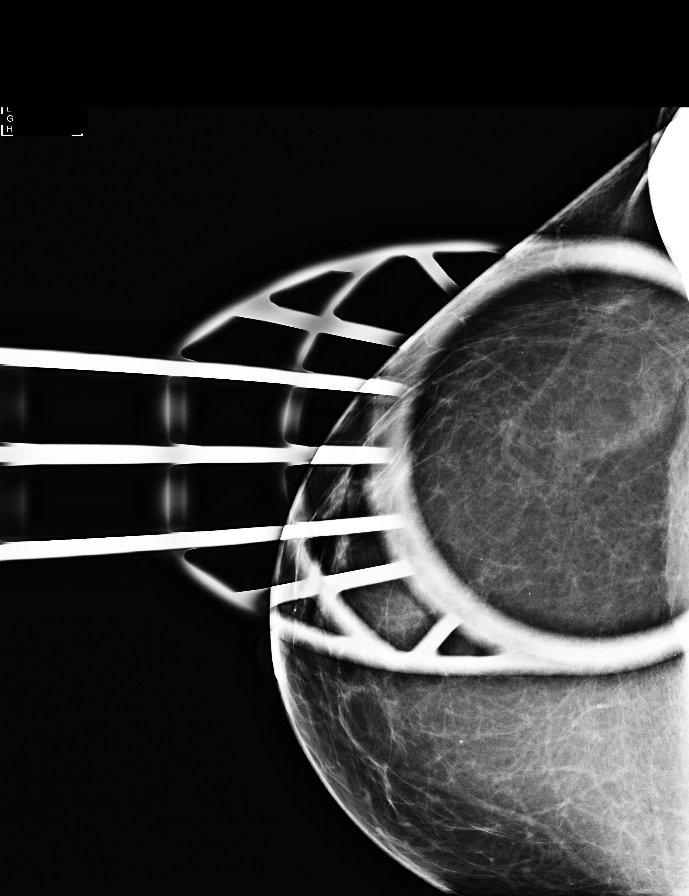

[R MLO synth-2D (1 of 2)]
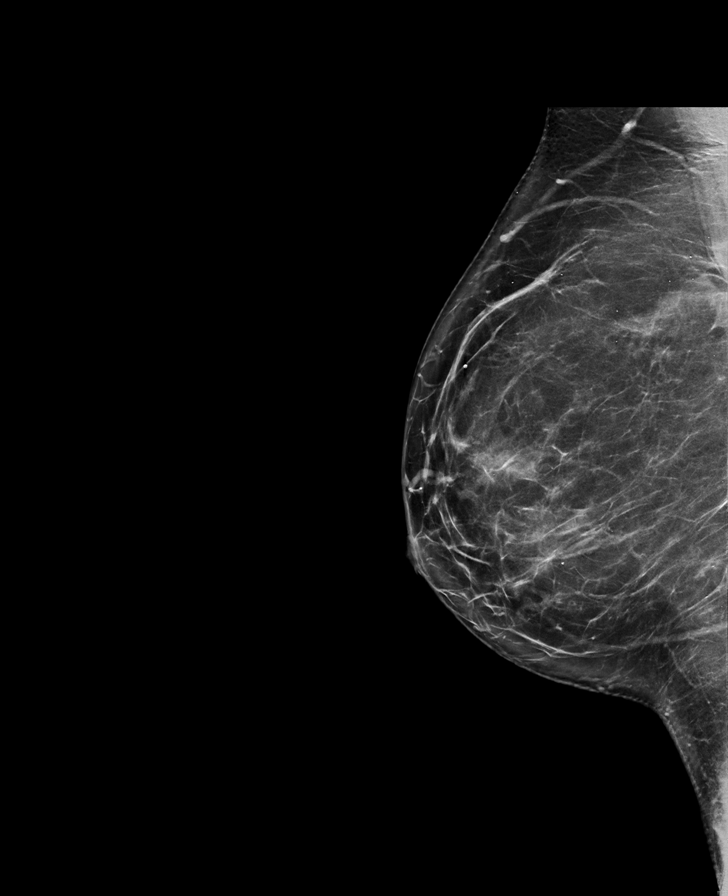

[R CC (2 of 2)]
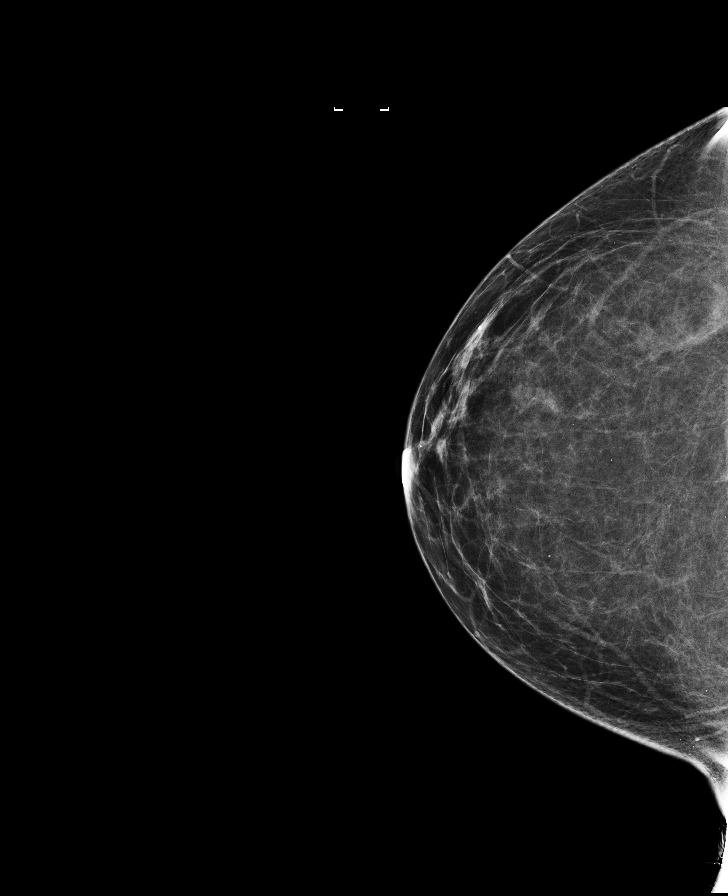

[L CC synth-2D]
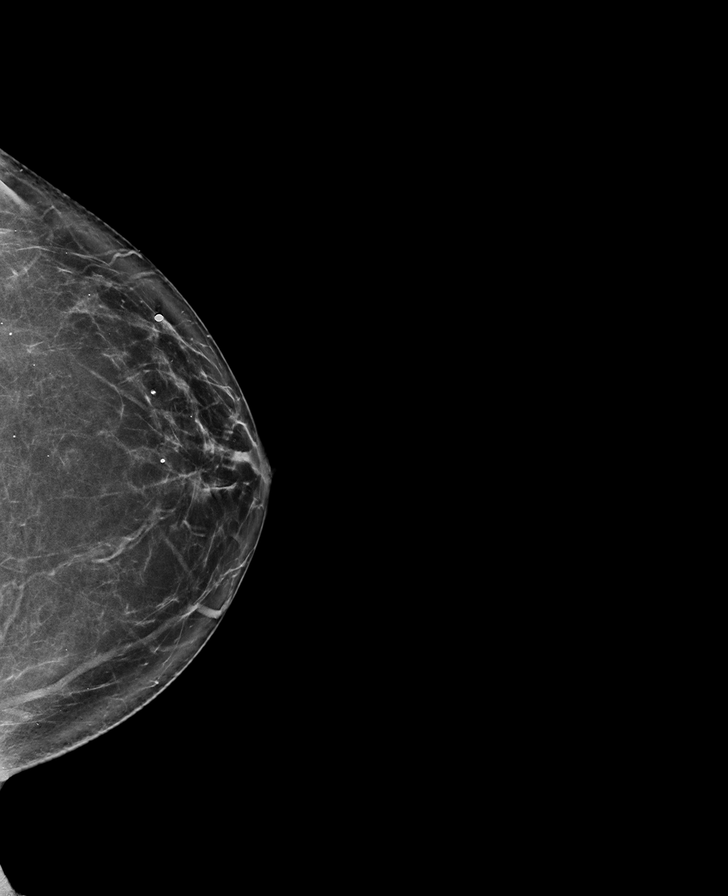

[L CC]
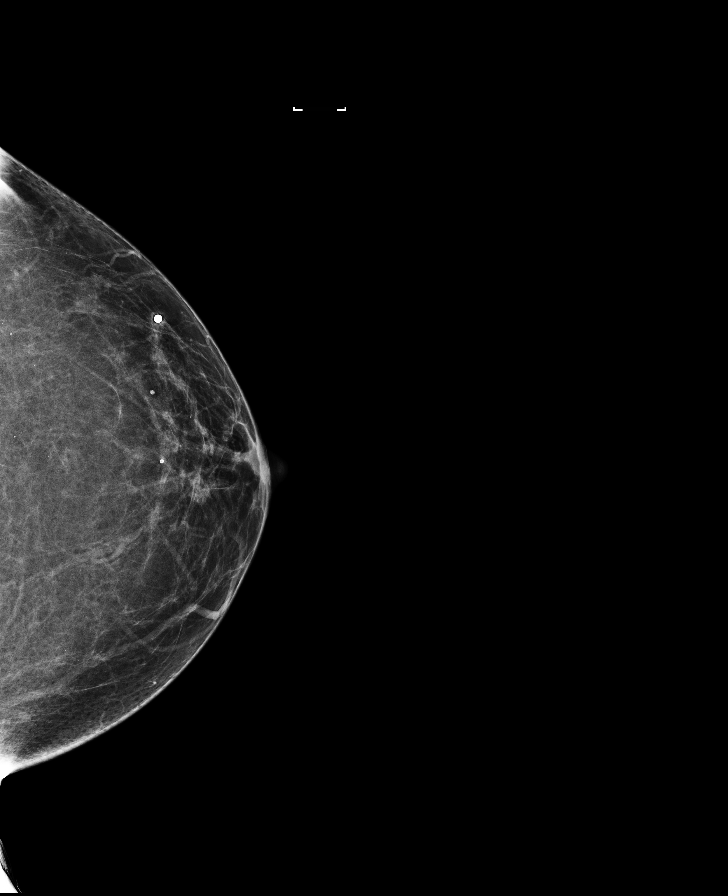

[R MLO synth-2D (2 of 2)]
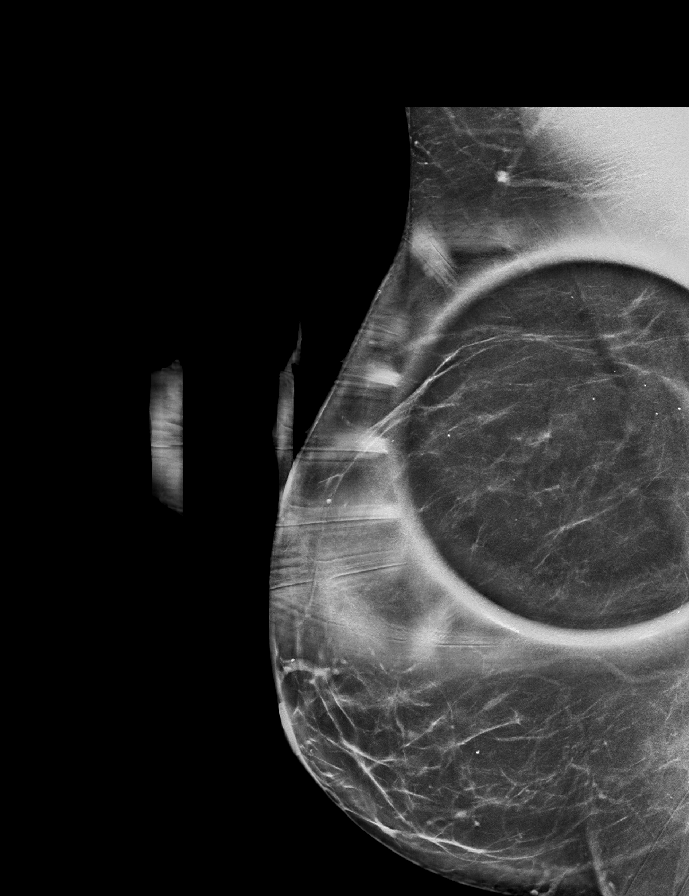

[R CC synth-2D]
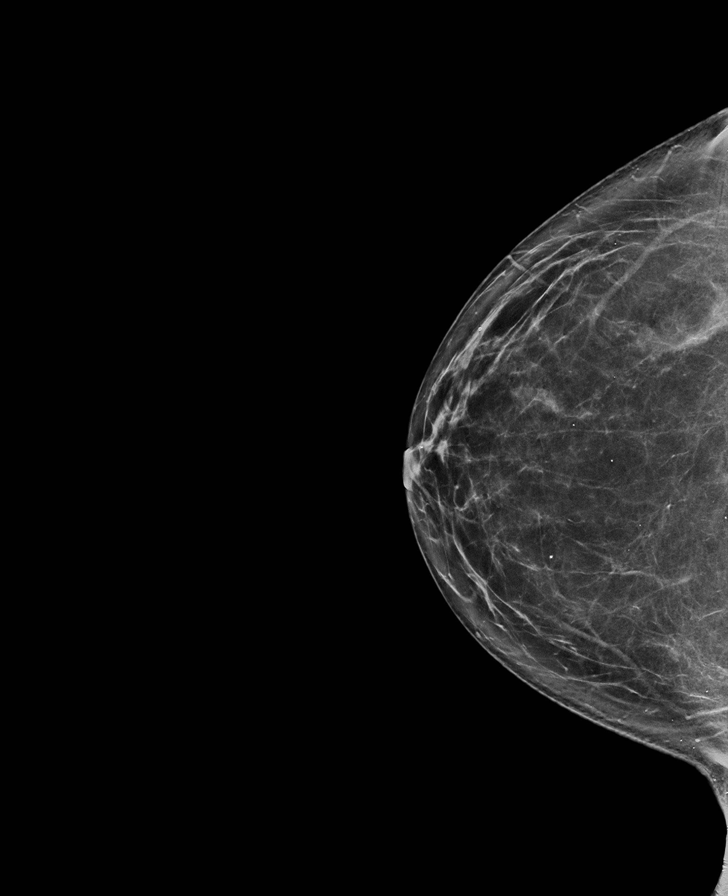

[L TAN synth-2D]
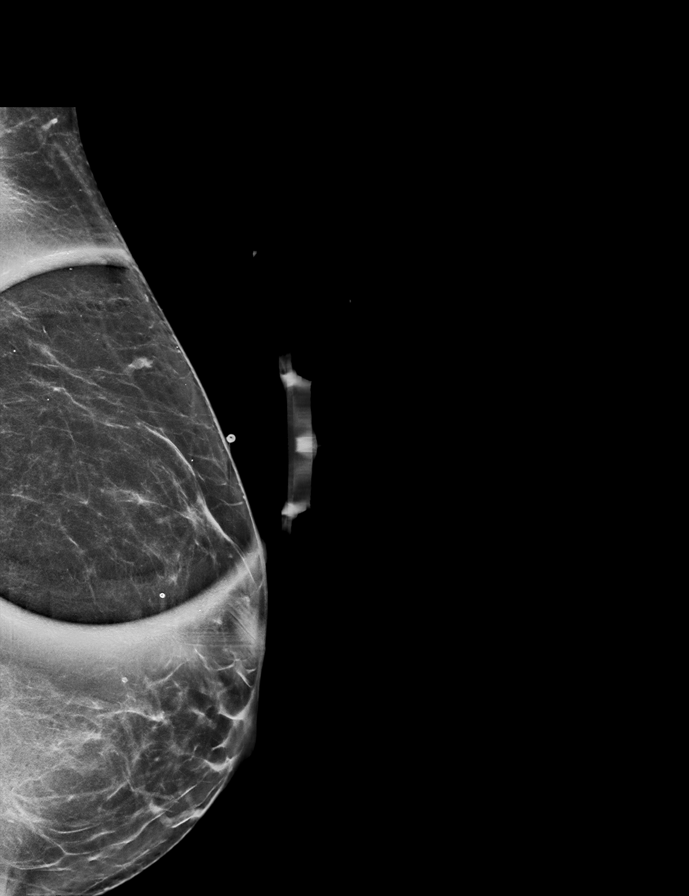

[8 of 40 positions shown; findings below may reference images not displayed]

ACR Breast Density Category b: There are scattered areas of
fibroglandular density.
FINDINGS: There is a low density asymmetry in the upper-outer posterior right
breast which demonstrates imaging features suggestive of an area of
focal fibroglandular tissue on the additional spot compression
tomograms, and is similar in appearance on the spot compression
tomograms when compared to prior MLO views from 1946 (although it is
felt that this asymmetry was not included in the field of view on
the prior CC views from 1946).

Spot compression tomograms were performed over the palpable/painful
area of concern in the upper-outer left breast with low density oval
asymmetry measuring approximately 7 mm present.

Mammographic images were processed with CAD.

Physical examination of the far outer right breast does not reveal
any palpable masses. Physical examination at site of palpable
concern in the slightly upper outer left breast reveals a slightly
mobile mass at the approximate [DATE] position.

Targeted ultrasound of the entire outer right breast was performed.
Small cysts are seen in the outer right breast, with a
representative cyst at the 9 o'clock position 5 cm from the nipple
measuring 0.3 x 0.2 x 0.3 cm. No suspicious masses or abnormalities
identified.

Targeted ultrasound of the upper-outer left breast at site of
palpable concern was performed. There is a mixed echogenicity mass
which is cystic centrally and hyperechoic peripherally at 2 o'clock
5 cm from nipple superficial depth measuring 1 x 0.7 x 0.3 cm. This
demonstrates imaging features most suggestive of fat necrosis and
corresponds with patient's history of prior trauma in bruising
involving the outer left breast.
IMPRESSION: 1. Probably benign right breast asymmetry demonstrating imaging
features suggestive of an area of focal/normal fibroglandular
tissue.

2. Probably benign left breast mass demonstrating imaging features
suggestive of an area of fat necrosis.

RECOMMENDATION:
Bilateral diagnostic mammography in 6 months (with possible
ultrasound) to demonstrate stability of the probably benign breast
findings.

I have discussed the findings and recommendations with the patient.
Results were also provided in writing at the conclusion of the
visit. If applicable, a reminder letter will be sent to the patient
regarding the next appointment.

BI-RADS CATEGORY  3: Probably benign.

## 2020-02-15 DIAGNOSIS — Z20828 Contact with and (suspected) exposure to other viral communicable diseases: Secondary | ICD-10-CM | POA: Diagnosis not present

## 2020-02-15 DIAGNOSIS — Z03818 Encounter for observation for suspected exposure to other biological agents ruled out: Secondary | ICD-10-CM | POA: Diagnosis not present

## 2020-02-15 DIAGNOSIS — J029 Acute pharyngitis, unspecified: Secondary | ICD-10-CM | POA: Diagnosis not present

## 2020-06-20 ENCOUNTER — Other Ambulatory Visit: Payer: Self-pay

## 2020-06-20 ENCOUNTER — Ambulatory Visit (INDEPENDENT_AMBULATORY_CARE_PROVIDER_SITE_OTHER): Payer: BC Managed Care – PPO

## 2020-06-20 ENCOUNTER — Encounter: Payer: Self-pay | Admitting: Podiatry

## 2020-06-20 ENCOUNTER — Ambulatory Visit (INDEPENDENT_AMBULATORY_CARE_PROVIDER_SITE_OTHER): Payer: BC Managed Care – PPO | Admitting: Podiatry

## 2020-06-20 DIAGNOSIS — M2012 Hallux valgus (acquired), left foot: Secondary | ICD-10-CM | POA: Diagnosis not present

## 2020-06-20 DIAGNOSIS — M21612 Bunion of left foot: Secondary | ICD-10-CM | POA: Diagnosis not present

## 2020-06-20 DIAGNOSIS — B351 Tinea unguium: Secondary | ICD-10-CM | POA: Diagnosis not present

## 2020-06-20 DIAGNOSIS — M21619 Bunion of unspecified foot: Secondary | ICD-10-CM

## 2020-06-20 NOTE — Patient Instructions (Signed)

## 2020-06-21 ENCOUNTER — Other Ambulatory Visit: Payer: Self-pay | Admitting: Podiatry

## 2020-06-21 DIAGNOSIS — L603 Nail dystrophy: Secondary | ICD-10-CM | POA: Diagnosis not present

## 2020-06-21 DIAGNOSIS — M2012 Hallux valgus (acquired), left foot: Secondary | ICD-10-CM

## 2020-06-21 DIAGNOSIS — L608 Other nail disorders: Secondary | ICD-10-CM | POA: Diagnosis not present

## 2020-06-24 NOTE — Progress Notes (Signed)
Subjective: 44 year old female presents the office today for main concerns of painful bunion left foot.  She states that this started become more uncomfortable and painful particular shoes as well as regular walking.  At this point she wants to discuss surgical intervention for the bunion.  Also she states that her right big toenail seems to be coming off and has been somewhat thick and discolored.  No pain in the nail. Denies any systemic complaints such as fevers, chills, nausea, vomiting. No acute changes since last appointment, and no other complaints at this time.   Objective: AAO x3, NAD DP/PT pulses palpable bilaterally, CRT less than 3 seconds Moderate bunion deformities present in the left foot and there is tenderness directly on the medial first MPJ and mild with MPJ range of motion there is no crepitation or restriction.  There is no first ray hypermobility present.  MMT 5/5. Right hallux toenail is mildly dystrophic, discolored with yellow-brown discoloration.  There is no straight edema, erythema or signs of infection. No open lesions or pre-ulcerative lesions.  No pain with calf compression, swelling, warmth, erythema  Assessment: Left foot bunion; onychomycosis  Plan: -All treatment options discussed with the patient including all alternatives, risks, complications.  -X-rays obtained reviewed.  Moderate bunion deformities present there is no significant arthritic changes present the first MPJ.  There is no evidence of acute fracture. -Discussed both conservative as well as surgical treatment options.  Like to proceed with surgical intervention.  Discussed first metatarsal ostomy with screw fixation.  She wants to proceed with this. -The incision placement as well as the postoperative course was discussed with the patient. I discussed risks of the surgery which include, but not limited to, infection, bleeding, pain, swelling, need for further surgery, delayed or nonhealing, painful or  ugly scar, numbness or sensation changes, over/under correction, recurrence, transfer lesions, further deformity, hardware failure, DVT/PE, loss of toe/foot. Patient understands these risks and wishes to proceed with surgery. The surgical consent was reviewed with the patient all 3 pages were signed. No promises or guarantees were given to the outcome of the procedure. All questions were answered to the best of my ability. Before the surgery the patient was encouraged to call the office if there is any further questions. The surgery will be performed at the [facility] on an outpatient basis. -CAM boot dispensed for postop use.  -Nail right hallux debrided and sent for culture/pathology to [facility] -Patient encouraged to call the office with any questions, concerns, change in symptoms.   Trula Slade DPM

## 2020-06-27 ENCOUNTER — Telehealth: Payer: Self-pay | Admitting: Podiatry

## 2020-06-27 NOTE — Telephone Encounter (Signed)
I spoke with Fredderick Phenix and she states does not have a specimen for this pt.

## 2020-06-27 NOTE — Telephone Encounter (Signed)
Pt was seen in office last week and had some nails sent off for a fungal culture and is calling back to check on the status of the results.  Please give patient a call.

## 2020-07-03 NOTE — Telephone Encounter (Signed)
Called [facility] and spoke with [facility] about the patient's nail culture and is being faxed to [phone] today. Lattie Haw

## 2020-07-24 ENCOUNTER — Telehealth: Payer: BC Managed Care – PPO | Admitting: Family

## 2020-07-24 DIAGNOSIS — R059 Cough, unspecified: Secondary | ICD-10-CM

## 2020-07-24 DIAGNOSIS — Z20828 Contact with and (suspected) exposure to other viral communicable diseases: Secondary | ICD-10-CM | POA: Diagnosis not present

## 2020-07-24 DIAGNOSIS — R05 Cough: Secondary | ICD-10-CM

## 2020-07-24 MED ORDER — BENZONATATE 100 MG PO CAPS: 100.0000 mg | ORAL_CAPSULE | Freq: Three times a day (TID) | ORAL | 0 refills | Status: DC | PRN

## 2020-07-24 NOTE — Progress Notes (Signed)
We are sorry that you are not feeling well.  Here is how we plan to help!  Based on your presentation I believe you most likely have A cough due to a virus.  This is called viral bronchitis and is best treated by rest, plenty of fluids and control of the cough.  You may use Ibuprofen or Tylenol as directed to help your symptoms.     In addition you may use A non-prescription cough medication called Robitussin DAC. Take 2 teaspoons every 8 hours or Delsym: take 2 teaspoons every 12 hours., A non-prescription cough medication called Mucinex DM: take 2 tablets every 12 hours. and A prescription cough medication called Tessalon Perles 131m. You may take 1-2 capsules every 8 hours as needed for your cough.  You also need to be tested for COVID.   From your responses in the eVisit questionnaire you describe inflammation in the upper respiratory tract which is causing a significant cough.  This is commonly called Bronchitis and has four common causes:    Allergies  Viral Infections  Acid Reflux  Bacterial Infection Allergies, viruses and acid reflux are treated by controlling symptoms or eliminating the cause. An example might be a cough caused by taking certain blood pressure medications. You stop the cough by changing the medication. Another example might be a cough caused by acid reflux. Controlling the reflux helps control the cough.  USE OF BRONCHODILATOR ("RESCUE") INHALERS: There is a risk from using your bronchodilator too frequently.  The risk is that over-reliance on a medication which only relaxes the muscles surrounding the breathing tubes can reduce the effectiveness of medications prescribed to reduce swelling and congestion of the tubes themselves.  Although you feel brief relief from the bronchodilator inhaler, your asthma may actually be worsening with the tubes becoming more swollen and filled with mucus.  This can delay other crucial treatments, such as oral steroid medications. If  you need to use a bronchodilator inhaler daily, several times per day, you should discuss this with your provider.  There are probably better treatments that could be used to keep your asthma under control.     HOME CARE . Only take medications as instructed by your medical team. . Complete the entire course of an antibiotic. . Drink plenty of fluids and get plenty of rest. . Avoid close contacts especially the very young and the elderly . Cover your mouth if you cough or cough into your sleeve. . Always remember to wash your hands . A steam or ultrasonic humidifier can help congestion.   GET HELP RIGHT AWAY IF: . You develop worsening fever. . You become short of breath . You cough up blood. . Your symptoms persist after you have completed your treatment plan MAKE SURE YOU   Understand these instructions.  Will watch your condition.  Will get help right away if you are not doing well or get worse.  Your e-visit answers were reviewed by a board certified advanced clinical practitioner to complete your personal care plan.  Depending on the condition, your plan could have included both over the counter or prescription medications. If there is a problem please reply  once you have received a response from your provider. Your safety is important to [place]  If you have drug allergies check your prescription carefully.    You can use MyChart to ask questions about today's visit, request a non-urgent call back, or ask for a work or school excuse for 24 hours related to  this e-Visit. If it has been greater than 24 hours you will need to follow up with your provider, or enter a new e-Visit to address those concerns. You will get an e-mail in the next two days asking about your experience.  I hope that your e-visit has been valuable and will speed your recovery. Thank you for using e-visits.  Approximately 5 minutes was spent documenting and reviewing patient's chart.

## 2020-08-02 ENCOUNTER — Telehealth: Payer: Self-pay

## 2020-08-02 NOTE — Telephone Encounter (Signed)
DOS 08/07/2020  [place] BUNIONECTOMY LT - 12751  [employer] EFFECTIVE DATE - 12/01/2019  PLAN DEDUCTIBLE - $4000.00 W/ $7001.74 OUT OF POCKET - $4000.00 W/ $9449.67 COPAY $0.00 COINSURANCE - 0% PER SERVICE YEAR  NO AUTH REQUIRED PER WEBSITE

## 2020-08-05 ENCOUNTER — Encounter: Payer: Self-pay | Admitting: Physician Assistant

## 2020-08-06 NOTE — Telephone Encounter (Signed)
She would need a video appointment to discuss further and determine next steps.

## 2020-08-07 ENCOUNTER — Other Ambulatory Visit: Payer: Self-pay | Admitting: Podiatry

## 2020-08-07 ENCOUNTER — Telehealth: Payer: Self-pay

## 2020-08-07 DIAGNOSIS — M2012 Hallux valgus (acquired), left foot: Secondary | ICD-10-CM | POA: Diagnosis not present

## 2020-08-07 DIAGNOSIS — M25572 Pain in left ankle and joints of left foot: Secondary | ICD-10-CM | POA: Diagnosis not present

## 2020-08-07 DIAGNOSIS — M21612 Bunion of left foot: Secondary | ICD-10-CM | POA: Diagnosis not present

## 2020-08-07 MED ORDER — ONDANSETRON HCL 4 MG PO TABS: 4.0000 mg | ORAL_TABLET | Freq: Three times a day (TID) | ORAL | 0 refills | Status: AC | PRN

## 2020-08-07 MED ORDER — OXYCODONE-ACETAMINOPHEN 5-325 MG PO TABS: 1.0000 | ORAL_TABLET | Freq: Four times a day (QID) | ORAL | 0 refills | Status: DC | PRN

## 2020-08-07 MED ORDER — CLINDAMYCIN HCL 300 MG PO CAPS
300.0000 mg | ORAL_CAPSULE | Freq: Three times a day (TID) | ORAL | 0 refills | Status: AC
Start: 2020-08-07 — End: ?

## 2020-08-07 NOTE — Progress Notes (Signed)
Postop medications sent to the pharmacy  Attempted to call patient's husband postop but no answer.

## 2020-08-07 NOTE — Telephone Encounter (Signed)
Pts husband called today and stated that his wife had surgery on her foot today. The pts foot was hot and burning and they were concerned if this was normal or not. Please advise.

## 2020-08-08 ENCOUNTER — Encounter: Payer: Self-pay | Admitting: Podiatry

## 2020-08-08 ENCOUNTER — Other Ambulatory Visit: Payer: Self-pay | Admitting: Podiatry

## 2020-08-08 ENCOUNTER — Telehealth: Payer: Self-pay | Admitting: Podiatry

## 2020-08-08 MED ORDER — IBUPROFEN 800 MG PO TABS: 800.0000 mg | ORAL_TABLET | Freq: Three times a day (TID) | ORAL | 0 refills | Status: AC | PRN

## 2020-08-08 NOTE — Telephone Encounter (Signed)
I left a VM on the patients phone. I have also called and spoken to the patient. I have sent ibuprofen 860m to the pharmacy. She has only been taking 1 percocet every 6 hours as she does not tolerate pain medication well, makes her nauseous. She is going to try 2. Discussed she can take 1 every 4 hours if needed as well. If she needs to come in tomorrow to change the bandage she can. She is going to see how she does overnight. Otherwise, no other concerns.

## 2020-08-08 NOTE — Telephone Encounter (Signed)
Pt returned your called and would like for you to call her husband again. Please and thank you

## 2020-08-08 NOTE — Telephone Encounter (Signed)
Left VM on husbands and patients VM to call back. Went straight to VM.  Also sent MyChart message

## 2020-08-08 NOTE — Telephone Encounter (Signed)
I called patient back. Her foot is burning and feels like it is wrapped too tight. Informed her to loosen the ACE bandage. Also the nerve block may be wearing off. Continue to ice/elevate. If symptoms continue to call back.

## 2020-08-08 NOTE — Telephone Encounter (Signed)
Pt's husband called back to return your call

## 2020-08-08 NOTE — Telephone Encounter (Signed)
Thank you :)

## 2020-08-08 NOTE — Telephone Encounter (Signed)
I spoke to the patient

## 2020-08-08 NOTE — Telephone Encounter (Signed)
Pt husband called again and stated that the pt is in a lot of pain. He also stated that its ok to leave a detailed message.

## 2020-08-12 ENCOUNTER — Ambulatory Visit (INDEPENDENT_AMBULATORY_CARE_PROVIDER_SITE_OTHER): Payer: BC Managed Care – PPO

## 2020-08-12 ENCOUNTER — Ambulatory Visit (INDEPENDENT_AMBULATORY_CARE_PROVIDER_SITE_OTHER): Payer: BC Managed Care – PPO | Admitting: Podiatry

## 2020-08-12 ENCOUNTER — Other Ambulatory Visit: Payer: Self-pay

## 2020-08-12 ENCOUNTER — Encounter: Payer: BC Managed Care – PPO | Admitting: Podiatry

## 2020-08-12 ENCOUNTER — Encounter: Payer: Self-pay | Admitting: Podiatry

## 2020-08-12 DIAGNOSIS — M21612 Bunion of left foot: Secondary | ICD-10-CM | POA: Diagnosis not present

## 2020-08-12 DIAGNOSIS — M21619 Bunion of unspecified foot: Secondary | ICD-10-CM

## 2020-08-12 DIAGNOSIS — Z79899 Other long term (current) drug therapy: Secondary | ICD-10-CM

## 2020-08-12 MED ORDER — OXYCODONE-ACETAMINOPHEN 5-325 MG PO TABS: 1.0000 | ORAL_TABLET | Freq: Four times a day (QID) | ORAL | 0 refills | Status: DC | PRN

## 2020-08-12 MED ORDER — DOXYCYCLINE HYCLATE 100 MG PO TABS
100.0000 mg | ORAL_TABLET | Freq: Two times a day (BID) | ORAL | 0 refills | Status: DC
Start: 2020-08-12 — End: 2020-10-11

## 2020-08-12 NOTE — Patient Instructions (Signed)
Terbinafine oral granules What is this medicine? TERBINAFINE (TER bin a feen) is an antifungal medicine. It is used to treat certain kinds of fungal or yeast infections. This medicine may be used for other purposes; ask your health care provider or pharmacist if you have questions. COMMON BRAND NAME(S): Lamisil What should I tell my health care provider before I take this medicine? They need to know if you have any of these conditions:  drink alcoholic beverages  kidney disease  liver disease  an unusual or allergic reaction to Terbinafine, other medicines, foods, dyes, or preservatives  pregnant or trying to get pregnant  breast-feeding How should I use this medicine? Take this medicine by mouth. Follow the directions on the prescription label. Hold packet with cut line on top. Shake packet gently to settle contents. Tear packet open along cut line, or use scissors to cut across line. Carefully pour the entire contents of packet onto a spoonful of a soft food, such as pudding or other soft, non-acidic food such as mashed potatoes (do NOT use applesauce or a fruit-based food). If two packets are required for each dose, you may either sprinkle the content of both packets on one spoonful of non-acidic food, or sprinkle the contents of both packets on two spoonfuls of non-acidic food. Make sure that no granules remain in the packet. Swallow the mxiture of the food and granules without chewing. Take your medicine at regular intervals. Do not take it more often than directed. Take all of your medicine as directed even if you think you are better. Do not skip doses or stop your medicine early. Contact your pediatrician or health care professional regarding the use of this medicine in children. While this medicine may be prescribed for children as young as 4 years for selected conditions, precautions do apply. Overdosage: If you think you have taken too much of this medicine contact a poison control  center or emergency room at once. NOTE: This medicine is only for you. Do not share this medicine with others. What if I miss a dose? If you miss a dose, take it as soon as you can. If it is almost time for your next dose, take only that dose. Do not take double or extra doses. What may interact with this medicine? Do not take this medicine with any of the following medications:  thioridazine This medicine may also interact with the following medications:  beta-blockers  caffeine  cimetidine  cyclosporine  MAOIs like Carbex, Eldepryl, Marplan, Nardil, and Parnate  medicines for fungal infections like fluconazole and ketoconazole  medicines for irregular heartbeat like amiodarone, flecainide and propafenone  rifampin  SSRIs like citalopram, escitalopram, fluoxetine, fluvoxamine, paroxetine and sertraline  tricyclic antidepressants like amitriptyline, clomipramine, desipramine, imipramine, nortriptyline, and others  warfarin This list may not describe all possible interactions. Give your health care provider a list of all the medicines, herbs, non-prescription drugs, or dietary supplements you use. Also tell them if you smoke, drink alcohol, or use illegal drugs. Some items may interact with your medicine. What should I watch for while using this medicine? Your doctor may monitor your liver function. Tell your doctor right away if you have nausea or vomiting, loss of appetite, stomach pain on your right upper side, yellow skin, dark urine, light stools, or are over tired. This medicine may cause serious skin reactions. They can happen weeks to months after starting the medicine. Contact your health care provider right away if you notice fevers or flu-like symptoms  with a rash. The rash may be red or purple and then turn into blisters or peeling of the skin. Or, you might notice a red rash with swelling of the face, lips or lymph nodes in your neck or under your arms. You need to take  this medicine for 6 weeks or longer to cure the fungal infection. Take your medicine regularly for as long as your doctor or health care provider tells you to. What side effects may I notice from receiving this medicine? Side effects that you should report to your doctor or health care professional as soon as possible:  allergic reactions like skin rash or hives, swelling of the face, lips, or tongue  change in vision  dark urine  fever or infection  general ill feeling or flu-like symptoms  light-colored stools  loss of appetite, nausea  rash, fever, and swollen lymph nodes  redness, blistering, peeling or loosening of the skin, including inside the mouth  right upper belly pain  unusually weak or tired  yellowing of the eyes or skin Side effects that usually do not require medical attention (report to your doctor or health care professional if they continue or are bothersome):  changes in taste  diarrhea  hair loss  muscle or joint pain  stomach upset This list may not describe all possible side effects. Call your doctor for medical advice about side effects. You may report side effects to FDA at 1-800-FDA-1088. Where should I keep my medicine? Keep out of the reach of children. Store at room temperature between 15 and 30 degrees C (59 and 86 degrees F). Throw away any unused medicine after the expiration date. NOTE: This sheet is a summary. It may not cover all possible information. If you have questions about this medicine, talk to your doctor, pharmacist, or health care provider.  2020 Elsevier/Gold Standard (2019-02-24 15:35:11)

## 2020-08-16 NOTE — Progress Notes (Signed)
Subjective: Lisa Hendrix is a 44 y.o. is seen today in office s/p left foot [place] bunionectomy preformed on 08/07/2020.  She states that she still having pain.  Today she is having more pain and she returned to work and she is not taking any pain medication.  She does take the Percocet which does help and she takes ibuprofen intermittently.  Denies any systemic complaints such as fevers, chills, nausea, vomiting. No calf pain, chest pain, shortness of breath.   Objective: General: No acute distress, AAOx3  DP/PT pulses palpable 2/4, CRT < 3 sec to all digits.  Protective sensation intact. Motor function intact.  Left foot: Incision is well coapted without any evidence of dehiscence with sutures intact. There is mild surrounding erythema likely more from inflammation as opposed to infection there is no ascending cellulitis, fluctuance, crepitus, malodor, drainage/purulence.  There is no warmth to the foot.  There is mild to moderate edema around the surgical site. There is moderate pain along the surgical site.  The toes in rectus position. Nails appear to be unchanged No other areas of tenderness to bilateral lower extremities.  No other open lesions or pre-ulcerative lesions.  No pain with calf compression, swelling, warmth, erythema.   Assessment and Plan:  Status post left foot surgery, doing well with no complications however with pain  -Treatment options discussed including all alternatives, risks, and complications -X-rays obtained reviewed.  Hardware intact status post first metatarsal osteotomy without any complicating factors. -Antibiotic ointment and dressing applied.  Keep the dressing clean, dry, intact -Surgical shoe was dispensed as well as I think the cam boot is causing discomfort. -Ice/elevation -Pain medication as needed. -Given the edema and faint erythema continue antibiotics and prescribed doxycycline.  This is more as a precaution. -Discussed nail culture  results.  She elects proceed with oral Lamisil.  Ordered CBC LFT.  Discussed side effects of medication. -Monitor for any clinical signs or symptoms of infection and DVT/PE and directed to call the office immediately should any occur or go to the ER. -Follow-up as scheduled or sooner if any problems arise. In the meantime, encouraged to call the office with any questions, concerns, change in symptoms.   Celesta Gentile, DPM

## 2020-08-22 ENCOUNTER — Ambulatory Visit (INDEPENDENT_AMBULATORY_CARE_PROVIDER_SITE_OTHER): Payer: BC Managed Care – PPO | Admitting: Podiatry

## 2020-08-22 ENCOUNTER — Other Ambulatory Visit: Payer: Self-pay

## 2020-08-22 DIAGNOSIS — M21619 Bunion of unspecified foot: Secondary | ICD-10-CM

## 2020-08-26 DIAGNOSIS — M21619 Bunion of unspecified foot: Secondary | ICD-10-CM | POA: Insufficient documentation

## 2020-08-26 NOTE — Progress Notes (Signed)
Subjective: Lisa Hendrix is a 44 y.o. is seen today in office s/p left foot [place] bunionectomy preformed on 08/07/2020.  States that she is feeling much better this week.  She has been using Darco wedge shoe and crutches to help take pressure off the surgical site.  She has no new concerns today.  She has not been taking pain medicine for the last couple of days. Denies any systemic complaints such as fevers, chills, nausea, vomiting. No calf pain, chest pain, shortness of breath.   Objective: General: No acute distress, AAOx3  DP/PT pulses palpable 2/4, CRT < 3 sec to all digits.  Protective sensation intact. Motor function intact.  Left foot: Incision is well coapted without any evidence of dehiscence with sutures intact.  There is minimal edema to the surgical site.  There is no significant pain today.  Improving range of motion of the first MPJ without any crepitation or pain.  No other areas of discomfort.  MMT 5/5. No other areas of tenderness to bilateral lower extremities.  No other open lesions or pre-ulcerative lesions.  No pain with calf compression, swelling, warmth, erythema.   Assessment and Plan:  Status post left foot surgery, doing well with no complications however with pain  -Treatment options discussed including all alternatives, risks, and complications -Overall doing better.  Antibiotic ointment and dressing applied.  She can start showering the incision wet and apply a similar dressing.  Continue range of motion exercises.  Continue ice elevate.  She can weight-bear in the boot as tolerated.  Trula Slade DPM

## 2020-09-05 ENCOUNTER — Ambulatory Visit (INDEPENDENT_AMBULATORY_CARE_PROVIDER_SITE_OTHER): Payer: BC Managed Care – PPO | Admitting: Podiatry

## 2020-09-05 ENCOUNTER — Ambulatory Visit (INDEPENDENT_AMBULATORY_CARE_PROVIDER_SITE_OTHER): Payer: BC Managed Care – PPO

## 2020-09-05 ENCOUNTER — Other Ambulatory Visit: Payer: Self-pay

## 2020-09-05 DIAGNOSIS — M21619 Bunion of unspecified foot: Secondary | ICD-10-CM

## 2020-09-05 DIAGNOSIS — M21612 Bunion of left foot: Secondary | ICD-10-CM

## 2020-09-10 NOTE — Progress Notes (Signed)
Subjective: Lisa Hendrix is a 44 y.o. is seen today in office s/p left foot [place] bunionectomy preformed on 08/07/2020.  Overall she is feeling better.  She has been walking the cam boot.  She continue ice elevation she started range of motion exercises.  Denies any recent injury or trauma since I last saw her she has no other concerns today. Denies any systemic complaints such as fevers, chills, nausea, vomiting. No calf pain, chest pain, shortness of breath.   Objective: General: No acute distress, AAOx3  DP/PT pulses palpable 2/4, CRT < 3 sec to all digits.  Protective sensation intact. Motor function intact.  Left foot: Incision is well coapted without any evidence of dehiscence and scar formed.  There is mild edema but there is no erythema or warmth.  Improved range of motion of first MPJ.  The toes in rectus position.  No significant discomfort to the surgical site today.  Overall improving.  No other open lesions or pre-ulcerative lesions.  No pain with calf compression, swelling, warmth, erythema.   Assessment and Plan:  Status post left foot surgery, doing well with no complications-improving  -Treatment options discussed including all alternatives, risks, and complications -X-rays obtained reviewed.  No evidence of acute fracture.  Status post first metatarsal review with screw fixation without any complicating factors -Continue in the cam boot for now continue ice elevation as well as range of motion exercises.  See her back in 2 weeks for repeat x-rays meantime start physical therapy and transition to regular shoe as tolerated.  *She has not yet had the blood work for Lamisil and she will go this week and get it done.  Trula Slade DPM

## 2020-09-16 ENCOUNTER — Ambulatory Visit (INDEPENDENT_AMBULATORY_CARE_PROVIDER_SITE_OTHER): Payer: BC Managed Care – PPO

## 2020-09-16 ENCOUNTER — Other Ambulatory Visit: Payer: Self-pay

## 2020-09-16 ENCOUNTER — Ambulatory Visit (INDEPENDENT_AMBULATORY_CARE_PROVIDER_SITE_OTHER): Payer: BC Managed Care – PPO | Admitting: Podiatry

## 2020-09-16 DIAGNOSIS — M21612 Bunion of left foot: Secondary | ICD-10-CM | POA: Diagnosis not present

## 2020-09-16 DIAGNOSIS — M21619 Bunion of unspecified foot: Secondary | ICD-10-CM

## 2020-09-16 DIAGNOSIS — Z79899 Other long term (current) drug therapy: Secondary | ICD-10-CM

## 2020-09-16 NOTE — Progress Notes (Signed)
Subjective: Lisa Hendrix is a 44 y.o. is seen today in office s/p left foot [place] bunionectomy preformed on 08/07/2020.  She states that she is doing much better.  She is ready ready to get out of the cam boot.  She still been icing elevating.  She is scheduled to get her blood work from [facility] on Friday.  No new concerns today. Denies any systemic complaints such as fevers, chills, nausea, vomiting. No calf pain, chest pain, shortness of breath.   Objective: General: No acute distress, AAOx3  DP/PT pulses palpable 2/4, CRT < 3 sec to all digits.  Protective sensation intact. Motor function intact.  Left foot: Incision is well coapted without any evidence of dehiscence and scar formed.  Improving to motion of the first MPJ with minimal edema.  There is no erythema or warmth.  Minimal discomfort at the surgical site but she is not taking any pain medication for this currently.  No other areas of discomfort.   Nails are unchanged.  No pain with calf compression, swelling, warmth, erythema.   Assessment and Plan:  Status post left foot surgery, doing well with no complications-improving  -Treatment options discussed including all alternatives, risks, and complications -X-rays were obtained and reviewed with the patient. Increased consolidation along the osteotomy site. Hardware intact. No fracture -At this time she can start to transition back into regular shoe as tolerated.  Continue ice elevate and continue range of motion exercises.  We discussed physical therapy but she seems to be doing well on her own.  There is any issues will refer to physical therapy. -We will follow up after blood work for Lamisil.  RTC 6 weeks or sooner if needed  Trula Slade DPM

## 2020-09-23 ENCOUNTER — Encounter: Payer: Self-pay | Admitting: Physician Assistant

## 2020-09-24 ENCOUNTER — Other Ambulatory Visit: Payer: Self-pay | Admitting: Emergency Medicine

## 2020-09-24 DIAGNOSIS — R922 Inconclusive mammogram: Secondary | ICD-10-CM

## 2020-09-24 DIAGNOSIS — Z1231 Encounter for screening mammogram for malignant neoplasm of breast: Secondary | ICD-10-CM

## 2020-09-24 DIAGNOSIS — N6320 Unspecified lump in the left breast, unspecified quadrant: Secondary | ICD-10-CM

## 2020-09-24 NOTE — Addendum Note (Signed)
Addended by: Leonidas Romberg on: 09/24/2020 08:57 AM   Modules accepted: Orders

## 2020-09-24 NOTE — Telephone Encounter (Signed)
Ok to order screening mammogram. Try to schedule her for CPE as well when you reach out to her. Thank you.

## 2020-09-26 ENCOUNTER — Other Ambulatory Visit: Payer: Self-pay | Admitting: Physician Assistant

## 2020-09-26 DIAGNOSIS — R922 Inconclusive mammogram: Secondary | ICD-10-CM

## 2020-09-26 DIAGNOSIS — N6320 Unspecified lump in the left breast, unspecified quadrant: Secondary | ICD-10-CM

## 2020-09-30 DIAGNOSIS — Z6838 Body mass index (BMI) 38.0-38.9, adult: Secondary | ICD-10-CM | POA: Diagnosis not present

## 2020-09-30 DIAGNOSIS — Z01419 Encounter for gynecological examination (general) (routine) without abnormal findings: Secondary | ICD-10-CM | POA: Diagnosis not present

## 2020-10-01 DIAGNOSIS — Z01419 Encounter for gynecological examination (general) (routine) without abnormal findings: Secondary | ICD-10-CM | POA: Diagnosis not present

## 2020-10-01 LAB — HM PAP SMEAR: HM Pap smear: NEGATIVE

## 2020-10-08 DIAGNOSIS — Z79899 Other long term (current) drug therapy: Secondary | ICD-10-CM | POA: Diagnosis not present

## 2020-10-09 LAB — HEPATIC FUNCTION PANEL
AG Ratio: 1.6 (calc) (ref 1.0–2.5)
ALT: 17 U/L (ref 6–29)
AST: 17 U/L (ref 10–30)
Albumin: 4.5 g/dL (ref 3.6–5.1)
Alkaline phosphatase (APISO): 62 U/L (ref 31–125)
Bilirubin, Direct: 0 mg/dL (ref 0.0–0.2)
Globulin: 2.9 g/dL (calc) (ref 1.9–3.7)
Indirect Bilirubin: 0.2 mg/dL (calc) (ref 0.2–1.2)
Total Bilirubin: 0.2 mg/dL (ref 0.2–1.2)
Total Protein: 7.4 g/dL (ref 6.1–8.1)

## 2020-10-09 LAB — CBC WITH DIFFERENTIAL/PLATELET
Absolute Monocytes: 281 cells/uL (ref 200–950)
Basophils Absolute: 32 cells/uL (ref 0–200)
Basophils Relative: 0.6 %
Eosinophils Absolute: 38 cells/uL (ref 15–500)
Eosinophils Relative: 0.7 %
HCT: 40.2 % (ref 35.0–45.0)
Hemoglobin: 13.4 g/dL (ref 11.7–15.5)
Lymphs Abs: 2095 cells/uL (ref 850–3900)
MCH: 30.2 pg (ref 27.0–33.0)
MCHC: 33.3 g/dL (ref 32.0–36.0)
MCV: 90.5 fL (ref 80.0–100.0)
MPV: 10.4 fL (ref 7.5–12.5)
Monocytes Relative: 5.2 %
Neutro Abs: 2954 cells/uL (ref 1500–7800)
Neutrophils Relative %: 54.7 %
Platelets: 288 10*3/uL (ref 140–400)
RBC: 4.44 10*6/uL (ref 3.80–5.10)
RDW: 12.7 % (ref 11.0–15.0)
Total Lymphocyte: 38.8 %
WBC: 5.4 10*3/uL (ref 3.8–10.8)

## 2020-10-10 ENCOUNTER — Telehealth: Payer: Self-pay | Admitting: Podiatry

## 2020-10-10 ENCOUNTER — Other Ambulatory Visit: Payer: Self-pay | Admitting: Podiatry

## 2020-10-10 MED ORDER — TERBINAFINE HCL 250 MG PO TABS: 250.0000 mg | ORAL_TABLET | Freq: Every day | ORAL | 0 refills | Status: AC

## 2020-10-10 NOTE — Telephone Encounter (Signed)
lvm for patient to call back and schedule a 4-6 week Lamisil check with Dr. Jacqualyn Posey

## 2020-10-11 ENCOUNTER — Other Ambulatory Visit: Payer: Self-pay

## 2020-10-11 ENCOUNTER — Ambulatory Visit (INDEPENDENT_AMBULATORY_CARE_PROVIDER_SITE_OTHER): Payer: BC Managed Care – PPO | Admitting: Physician Assistant

## 2020-10-11 ENCOUNTER — Encounter: Payer: Self-pay | Admitting: Physician Assistant

## 2020-10-11 VITALS — BP 118/80 | HR 76 | Temp 98.3°F | Resp 16 | Ht 66.0 in | Wt 232.0 lb

## 2020-10-11 DIAGNOSIS — E669 Obesity, unspecified: Secondary | ICD-10-CM

## 2020-10-11 DIAGNOSIS — E079 Disorder of thyroid, unspecified: Secondary | ICD-10-CM | POA: Diagnosis not present

## 2020-10-11 DIAGNOSIS — Z23 Encounter for immunization: Secondary | ICD-10-CM | POA: Diagnosis not present

## 2020-10-11 DIAGNOSIS — Z0000 Encounter for general adult medical examination without abnormal findings: Secondary | ICD-10-CM | POA: Diagnosis not present

## 2020-10-11 MED ORDER — SAXENDA 18 MG/3ML ~~LOC~~ SOPN: PEN_INJECTOR | SUBCUTANEOUS | 1 refills | Status: DC

## 2020-10-11 NOTE — Patient Instructions (Addendum)
Please speak with the front desk staff to schedule an appointment next week for your fasting blood work.  We will call with your results.  Keep up with diet and exercise. I am working to see about getting the [place] approved for you.     Preventive Care 69-44 Years Old, Female Preventive care refers to visits with your health care provider and lifestyle choices that can promote health and wellness. This includes:  A yearly physical exam. This may also be called an annual well check.  Regular dental visits and eye exams.  Immunizations.  Screening for certain conditions.  Healthy lifestyle choices, such as eating a healthy diet, getting regular exercise, not using drugs or products that contain nicotine and tobacco, and limiting alcohol use. What can I expect for my preventive care visit? Physical exam Your health care provider will check your:  Height and weight. This may be used to calculate body mass index (BMI), which tells if you are at a healthy weight.  Heart rate and blood pressure.  Skin for abnormal spots. Counseling Your health care provider may ask you questions about your:  Alcohol, tobacco, and drug use.  Emotional well-being.  Home and relationship well-being.  Sexual activity.  Eating habits.  Work and work Statistician.  Method of birth control.  Menstrual cycle.  Pregnancy history. What immunizations do I need?  Influenza (flu) vaccine  This is recommended every year. Tetanus, diphtheria, and pertussis (Tdap) vaccine  You may need a Td booster every 10 years. Varicella (chickenpox) vaccine  You may need this if you have not been vaccinated. Zoster (shingles) vaccine  You may need this after age 51. Measles, mumps, and rubella (MMR) vaccine  You may need at least one dose of MMR if you were born in 1957 or later. You may also need a second dose. Pneumococcal conjugate (PCV13) vaccine  You may need this if you have certain  conditions and were not previously vaccinated. Pneumococcal polysaccharide (PPSV23) vaccine  You may need one or two doses if you smoke cigarettes or if you have certain conditions. Meningococcal conjugate (MenACWY) vaccine  You may need this if you have certain conditions. Hepatitis A vaccine  You may need this if you have certain conditions or if you travel or work in places where you may be exposed to hepatitis A. Hepatitis B vaccine  You may need this if you have certain conditions or if you travel or work in places where you may be exposed to hepatitis B. Haemophilus influenzae type b (Hib) vaccine  You may need this if you have certain conditions. Human papillomavirus (HPV) vaccine  If recommended by your health care provider, you may need three doses over 6 months. You may receive vaccines as individual doses or as more than one vaccine together in one shot (combination vaccines). Talk with your health care provider about the risks and benefits of combination vaccines. What tests do I need? Blood tests  Lipid and cholesterol levels. These may be checked every 5 years, or more frequently if you are over 50 years old.  Hepatitis C test.  Hepatitis B test. Screening  Lung cancer screening. You may have this screening every year starting at age 60 if you have a 30-pack-year history of smoking and currently smoke or have quit within the past 15 years.  Colorectal cancer screening. All adults should have this screening starting at age 61 and continuing until age 89. Your health care provider may recommend screening at  age 50 if you are at increased risk. You will have tests every 1-10 years, depending on your results and the type of screening test.  Diabetes screening. This is done by checking your blood sugar (glucose) after you have not eaten for a while (fasting). You may have this done every 1-3 years.  Mammogram. This may be done every 1-2 years. Talk with your health care  provider about when you should start having regular mammograms. This may depend on whether you have a family history of breast cancer.  BRCA-related cancer screening. This may be done if you have a family history of breast, ovarian, tubal, or peritoneal cancers.  Pelvic exam and Pap test. This may be done every 3 years starting at age 52. Starting at age 76, this may be done every 5 years if you have a Pap test in combination with an HPV test. Other tests  Sexually transmitted disease (STD) testing.  Bone density scan. This is done to screen for osteoporosis. You may have this scan if you are at high risk for osteoporosis. Follow these instructions at home: Eating and drinking  Eat a diet that includes fresh fruits and vegetables, whole grains, lean protein, and low-fat dairy.  Take vitamin and mineral supplements as recommended by your health care provider.  Do not drink alcohol if: ? Your health care provider tells you not to drink. ? You are pregnant, may be pregnant, or are planning to become pregnant.  If you drink alcohol: ? Limit how much you have to 0-1 drink a day. ? Be aware of how much alcohol is in your drink. In the [place] one drink equals one 12 oz bottle of beer (355 mL), one 5 oz glass of wine (148 mL), or one 1 oz glass of hard liquor (44 mL). Lifestyle  Take daily care of your teeth and gums.  Stay active. Exercise for at least 30 minutes on 5 or more days each week.  Do not use any products that contain nicotine or tobacco, such as cigarettes, e-cigarettes, and chewing tobacco. If you need help quitting, ask your health care provider.  If you are sexually active, practice safe sex. Use a condom or other form of birth control (contraception) in order to prevent pregnancy and STIs (sexually transmitted infections).  If told by your health care provider, take low-dose aspirin daily starting at age 98. What's next?  Visit your health care provider once a year for a  well check visit.  Ask your health care provider how often you should have your eyes and teeth checked.  Stay up to date on all vaccines. This information is not intended to replace advice given to you by your health care provider. Make sure you discuss any questions you have with your health care provider. Document Revised: 07/28/2018 Document Reviewed: 07/28/2018 Elsevier Patient Education  2020 [employer].

## 2020-10-11 NOTE — Progress Notes (Signed)
Patient presents to clinic today for annual exam.  Patient is fasting for labs.  Acute Concerns: Denies acute concerns today.   Chronic Issues: Obesity -- Body mass index is 37.45 kg/m.  Has been keeping a well-balanced diet trying to stay active for some time.  Was in weight loss program and made substantial strides resulting in a 20 pound weight loss.  Has remained plateaued at this weight for the past several months despite continued efforts.  Would like to discuss the possibility of adding medication to her lifestyle changes.  Health Maintenance: Immunizations -- Declines flu shot. Due for TDaP. Agrees to screening.  Mammogram -- up-to-date. Records requested. PAP -- up-to-date. Records requested. HIV/Hep C Screening -- due for Hep C screen. Order placed.   Past Medical History:  Diagnosis Date  . Depression    denies  . Family history of adverse reaction to anesthesia    Mother- difficulty awaken- 2016- 1st time ever  . Headache    MIgraines  . History of kidney stones 2000   passed  . Pleurisy     Past Surgical History:  Procedure Laterality Date  . CHOLECYSTECTOMY N/A 10/05/2016   Procedure: LAPAROSCOPIC CHOLECYSTECTOMY;  Surgeon: Arta Bruce Kinsinger, MD;  Location: [facility];  Service: General;  Laterality: N/A;  . KNEE ARTHROSCOPY Left 2014   MCL repair  . TUMOR REMOVAL  01/2008   spine  . [place] EXTRACTION  1995    Current Outpatient Medications on File Prior to Visit  Medication Sig Dispense Refill  . acyclovir (ZOVIRAX) 200 MG capsule Take by mouth.    . clindamycin (CLEOCIN) 300 MG capsule Take 1 capsule (300 mg total) by mouth 3 (three) times daily. 9 capsule 0  . doxycycline (VIBRA-TABS) 100 MG tablet Take 1 tablet (100 mg total) by mouth 2 (two) times daily. 20 tablet 0  . ibuprofen (ADVIL) 800 MG tablet Take 1 tablet (800 mg total) by mouth every 8 (eight) hours as needed. 30 tablet 0  . levonorgestrel (MIRENA) 20 MCG/24HR IUD 1 each by Intrauterine  route once.    Marland Kitchen levothyroxine (SYNTHROID, LEVOTHROID) 25 MCG tablet Take 1 tablet (25 mcg total) by mouth daily before breakfast. 30 tablet 2  . levothyroxine (SYNTHROID, LEVOTHROID) 25 MCG tablet Take 1 tablet (25 mcg total) by mouth daily before breakfast. Last refill. Please contact office to schedule appointment 15 tablet 0  . neomycin-polymyxin-hydrocortisone (CORTISPORIN) OTIC solution Place 4 drops into the right ear 4 (four) times daily. 10 mL 0  . ondansetron (ZOFRAN) 4 MG tablet Take 1 tablet (4 mg total) by mouth every 8 (eight) hours as needed for nausea or vomiting. 20 tablet 0  . SUMAtriptan (IMITREX) 25 MG tablet TAKE 1 TABLET BY MOUTH ONCE. MAY REPEAT IN 2 HOURS IF HEADACHE PERSISTS 10 tablet 3  . terbinafine (LAMISIL) 250 MG tablet Take 1 tablet (250 mg total) by mouth daily. 90 tablet 0  . topiramate (TOPAMAX) 25 MG tablet TAKE 1 TABLET BY MOUTH TWICE A DAY 60 tablet 3  . triamcinolone (NASACORT AQ) 55 MCG/ACT AERO nasal inhaler Place 2 sprays into the nose daily as needed. (Patient taking differently: Place 2 sprays into the nose daily as needed (allergies). ) 1 Inhaler 3  . valACYclovir (VALTREX) 1000 MG tablet TAKE TWO TABLETS TWICE DAILY FOR 1 DAY 4 tablet 1   No current facility-administered medications on file prior to visit.    Allergies  Allergen Reactions  . Penicillins Swelling    SWELLING  REACTION UNSPECIFIED   Has patient had a PCN reaction causing immediate rash, facial/tongue/throat swelling, SOB or lightheadedness with hypotension: Yes Has patient had a PCN reaction causing severe rash involving mucus membranes or skin necrosis: No Has patient had a PCN reaction that required hospitalization Unknown Has patient had a PCN reaction occurring within the last 10 years: No If all of the above answers are "NO", then may proceed with Cephalosporin use.     Family History  Problem Relation Age of Onset  . Diabetes Mother   . Hyperlipidemia Mother   .  Hypertension Mother   . Alcohol abuse Father   . Cancer Maternal Grandmother        breast Cancer  . Diabetes Maternal Grandmother   . Heart disease Maternal Grandfather   . Diabetes Maternal Grandfather   . Cancer Paternal Grandmother        Breast  . Breast cancer Paternal Grandmother   . Cancer Maternal Aunt        Breast  . Cancer Paternal Aunt        breast    Social History   Socioeconomic History  . Marital status: Married    Spouse name: Not on file  . Number of children: Not on file  . Years of education: Not on file  . Highest education level: Not on file  Occupational History  . Not on file  Tobacco Use  . Smoking status: Never Smoker  . Smokeless tobacco: Never Used  Substance and Sexual Activity  . Alcohol use: No    Comment: socially  . Drug use: No  . Sexual activity: Not on file  Other Topics Concern  . Not on file  Social History Narrative  . Not on file   Social Determinants of Health   Financial Resource Strain:   . Difficulty of Paying Living Expenses: Not on file  Food Insecurity:   . Worried About Charity fundraiser in the Last Year: Not on file  . Ran Out of Food in the Last Year: Not on file  Transportation Needs:   . Lack of Transportation (Medical): Not on file  . Lack of Transportation (Non-Medical): Not on file  Physical Activity:   . Days of Exercise per Week: Not on file  . Minutes of Exercise per Session: Not on file  Stress:   . Feeling of Stress : Not on file  Social Connections:   . Frequency of Communication with Friends and Family: Not on file  . Frequency of Social Gatherings with Friends and Family: Not on file  . Attends Religious Services: Not on file  . Active Member of Clubs or Organizations: Not on file  . Attends Archivist Meetings: Not on file  . Marital Status: Not on file  Intimate Partner Violence:   . Fear of Current or Ex-Partner: Not on file  . Emotionally Abused: Not on file  . Physically  Abused: Not on file  . Sexually Abused: Not on file   Review of Systems  Constitutional: Negative for fever and weight loss.  HENT: Negative for ear discharge, ear pain, hearing loss and tinnitus.   Eyes: Negative for blurred vision, double vision, photophobia and pain.  Respiratory: Negative for cough and shortness of breath.   Cardiovascular: Negative for chest pain and palpitations.  Gastrointestinal: Negative for abdominal pain, blood in stool, constipation, diarrhea, heartburn, melena, nausea and vomiting.  Genitourinary: Negative for dysuria, flank pain, frequency, hematuria and urgency.  Musculoskeletal: Negative  for falls.  Neurological: Negative for dizziness, loss of consciousness and headaches.  Endo/Heme/Allergies: Negative for environmental allergies.  Psychiatric/Behavioral: Negative for depression, hallucinations, substance abuse and suicidal ideas. The patient is not nervous/anxious and does not have insomnia.    Resp 16   Ht 5' 6"  (1.676 m)   Wt 232 lb (105.2 kg)   BMI 37.45 kg/m   Physical Exam Vitals reviewed.  Constitutional:      Appearance: Normal appearance.  HENT:     Head: Normocephalic and atraumatic.     Right Ear: Tympanic membrane, ear canal and external ear normal.     Left Ear: Tympanic membrane, ear canal and external ear normal.     Nose: Nose normal. No mucosal edema.     Mouth/Throat:     Pharynx: Uvula midline. No oropharyngeal exudate or posterior oropharyngeal erythema.  Eyes:     Conjunctiva/sclera: Conjunctivae normal.     Pupils: Pupils are equal, round, and reactive to light.  Neck:     Thyroid: No thyromegaly.  Cardiovascular:     Rate and Rhythm: Normal rate and regular rhythm.     Pulses: Normal pulses.     Heart sounds: Normal heart sounds.  Pulmonary:     Effort: Pulmonary effort is normal. No respiratory distress.     Breath sounds: Normal breath sounds. No wheezing or rales.  Abdominal:     General: Bowel sounds are  normal. There is no distension.     Palpations: Abdomen is soft. There is no mass.     Tenderness: There is no abdominal tenderness. There is no guarding or rebound.  Musculoskeletal:     Cervical back: Neck supple.  Lymphadenopathy:     Cervical: No cervical adenopathy.  Skin:    General: Skin is warm and dry.     Findings: No rash.  Neurological:     General: No focal deficit present.     Mental Status: She is alert and oriented to person, place, and time.     Cranial Nerves: No cranial nerve deficit.  Psychiatric:        Mood and Affect: Mood normal.     Recent Results (from the past 2160 hour(s))  Hepatic Function Panel     Status: None   Collection Time: 10/08/20  1:23 PM  Result Value Ref Range   Total Protein 7.4 6.1 - 8.1 g/dL   Albumin 4.5 3.6 - 5.1 g/dL   Globulin 2.9 1.9 - 3.7 g/dL (calc)   AG Ratio 1.6 1.0 - 2.5 (calc)   Total Bilirubin 0.2 0.2 - 1.2 mg/dL   Bilirubin, Direct 0.0 0.0 - 0.2 mg/dL   Indirect Bilirubin 0.2 0.2 - 1.2 mg/dL (calc)   Alkaline phosphatase (APISO) 62 31 - 125 U/L   AST 17 10 - 30 U/L   ALT 17 6 - 29 U/L  CBC with Differential/Platelet     Status: None   Collection Time: 10/08/20  1:23 PM  Result Value Ref Range   WBC 5.4 3.8 - 10.8 Thousand/uL   RBC 4.44 3.80 - 5.10 Million/uL   Hemoglobin 13.4 11.7 - 15.5 g/dL   HCT 40.2 35 - 45 %   MCV 90.5 80.0 - 100.0 fL   MCH 30.2 27.0 - 33.0 pg   MCHC 33.3 32.0 - 36.0 g/dL   RDW 12.7 11.0 - 15.0 %   Platelets 288 140 - 400 Thousand/uL   MPV 10.4 7.5 - 12.5 fL   Neutro Abs 2,954 1,500 -  7,800 cells/uL   Lymphs Abs 2,095 850 - 3,900 cells/uL   Absolute Monocytes 281 200 - 950 cells/uL   Eosinophils Absolute 38 15.0 - 500.0 cells/uL   Basophils Absolute 32 0.0 - 200.0 cells/uL   Neutrophils Relative % 54.7 %   Total Lymphocyte 38.8 %   Monocytes Relative 5.2 %   Eosinophils Relative 0.7 %   Basophils Relative 0.6 %    Assessment/Plan: 1. Visit for preventive health  examination Depression screen negative. Health Maintenance reviewed -- will obtain PAP records from GYN. Preventive schedule discussed and handout given in AVS. Will obtain fasting labs today.  - CBC with Differential/Platelet; Future  2. Need for Tdap vaccination - Tdap vaccine greater than or equal to 7yo IM  3. Thyroid condition Repeat TSH levels today. Is taking medication as directed. Will adjust dose if not at euthyroid state.  - TSH; Future  4. Obesity (BMI 30-39.9) Repeat labs today to risk stratify. Will have her continue diet and exercise regimen. Will see about addition of Saxenda or Wegovy to current regimen. She is aware that not all insurance plans are covering these medications. Will likely need PA at the very least.  - Hemoglobin A1c; Future - Lipid panel; Future - Basic metabolic panel; Future - TSH; Future    This visit occurred during the SARS-CoV-2 public health emergency.  Safety protocols were in place, including screening questions prior to the visit, additional usage of staff PPE, and extensive cleaning of exam room while observing appropriate contact time as indicated for disinfecting solutions.    Leeanne Rio, PA-C

## 2020-10-14 ENCOUNTER — Telehealth: Payer: Self-pay | Admitting: Emergency Medicine

## 2020-10-14 ENCOUNTER — Ambulatory Visit (INDEPENDENT_AMBULATORY_CARE_PROVIDER_SITE_OTHER): Payer: BC Managed Care – PPO

## 2020-10-14 ENCOUNTER — Other Ambulatory Visit: Payer: Self-pay

## 2020-10-14 DIAGNOSIS — Z0000 Encounter for general adult medical examination without abnormal findings: Secondary | ICD-10-CM

## 2020-10-14 DIAGNOSIS — E669 Obesity, unspecified: Secondary | ICD-10-CM

## 2020-10-14 DIAGNOSIS — E079 Disorder of thyroid, unspecified: Secondary | ICD-10-CM | POA: Diagnosis not present

## 2020-10-14 DIAGNOSIS — Z30433 Encounter for removal and reinsertion of intrauterine contraceptive device: Secondary | ICD-10-CM | POA: Diagnosis not present

## 2020-10-14 LAB — BASIC METABOLIC PANEL
BUN: 12 mg/dL (ref 6–23)
CO2: 25 mEq/L (ref 19–32)
Calcium: 9 mg/dL (ref 8.4–10.5)
Chloride: 104 mEq/L (ref 96–112)
Creatinine, Ser: 0.87 mg/dL (ref 0.40–1.20)
GFR: 80.81 mL/min (ref >60.00)
Glucose, Bld: 89 mg/dL (ref 70–99)
Potassium: 4.8 mEq/L (ref 3.5–5.1)
Sodium: 137 mEq/L (ref 135–145)

## 2020-10-14 LAB — CBC WITH DIFFERENTIAL/PLATELET
Basophils Absolute: 0 10*3/uL (ref 0.0–0.1)
Basophils Relative: 0.8 % (ref 0.0–3.0)
Eosinophils Absolute: 0 10*3/uL (ref 0.0–0.7)
Eosinophils Relative: 0.8 % (ref 0.0–5.0)
HCT: 38.6 % (ref 36.0–46.0)
Hemoglobin: 13.2 g/dL (ref 12.0–15.0)
Lymphocytes Relative: 36.4 % (ref 12.0–46.0)
Lymphs Abs: 1.7 10*3/uL (ref 0.7–4.0)
MCHC: 34.2 g/dL (ref 30.0–36.0)
MCV: 89.7 fl (ref 78.0–100.0)
Monocytes Absolute: 0.3 10*3/uL (ref 0.1–1.0)
Monocytes Relative: 6.1 % (ref 3.0–12.0)
Neutro Abs: 2.6 10*3/uL (ref 1.4–7.7)
Neutrophils Relative %: 55.9 % (ref 43.0–77.0)
Platelets: 255 10*3/uL (ref 150.0–400.0)
RBC: 4.3 Mil/uL (ref 3.87–5.11)
RDW: 13.6 % (ref 11.5–15.5)
WBC: 4.6 10*3/uL (ref 4.0–10.5)

## 2020-10-14 LAB — LIPID PANEL
Cholesterol: 156 mg/dL (ref 0–200)
HDL: 53.5 mg/dL (ref >39.00)
LDL Cholesterol: 85 mg/dL (ref 0–99)
NonHDL: 102.04
Total CHOL/HDL Ratio: 3
Triglycerides: 84 mg/dL (ref 0.0–149.0)
VLDL: 16.8 mg/dL (ref 0.0–40.0)

## 2020-10-14 LAB — HEMOGLOBIN A1C: Hgb A1c MFr Bld: 5.7 % (ref 4.6–6.5)

## 2020-10-14 LAB — TSH: TSH: 1.52 u[IU]/mL (ref 0.35–4.50)

## 2020-10-14 NOTE — Telephone Encounter (Signed)
Saxenda approved from 10/11/20-02/13/2021 Reference #: [id number]   Prior authorization started thru Cover My Meds for patient medication: Saxenda 76m/ml injection on 10/11/2020 Key: [id number] [id number]

## 2020-10-15 ENCOUNTER — Other Ambulatory Visit: Payer: Self-pay | Admitting: Physician Assistant

## 2020-10-15 ENCOUNTER — Encounter: Payer: Self-pay | Admitting: Physician Assistant

## 2020-10-15 DIAGNOSIS — B001 Herpesviral vesicular dermatitis: Secondary | ICD-10-CM

## 2020-10-15 MED ORDER — ACYCLOVIR 200 MG PO CAPS: 200.0000 mg | ORAL_CAPSULE | ORAL | 1 refills | Status: AC | PRN

## 2020-10-18 ENCOUNTER — Encounter: Payer: Self-pay | Admitting: Physician Assistant

## 2020-10-18 DIAGNOSIS — Z1231 Encounter for screening mammogram for malignant neoplasm of breast: Secondary | ICD-10-CM | POA: Diagnosis not present

## 2020-10-18 NOTE — Progress Notes (Unsigned)
Abstract

## 2020-10-28 ENCOUNTER — Ambulatory Visit (INDEPENDENT_AMBULATORY_CARE_PROVIDER_SITE_OTHER): Payer: BC Managed Care – PPO | Admitting: Podiatry

## 2020-10-28 ENCOUNTER — Other Ambulatory Visit: Payer: Self-pay

## 2020-10-28 ENCOUNTER — Ambulatory Visit (INDEPENDENT_AMBULATORY_CARE_PROVIDER_SITE_OTHER): Payer: BC Managed Care – PPO

## 2020-10-28 DIAGNOSIS — M21619 Bunion of unspecified foot: Secondary | ICD-10-CM

## 2020-10-28 DIAGNOSIS — M21612 Bunion of left foot: Secondary | ICD-10-CM | POA: Diagnosis not present

## 2020-10-28 MED ORDER — METHYLPREDNISOLONE 4 MG PO TBPK: ORAL_TABLET | ORAL | 0 refills | Status: DC

## 2020-10-30 NOTE — Progress Notes (Signed)
Subjective: 44 year old female presents the office today for urgent work in appointment.  She states on Thanksgiving day she took a step within her foot and she felt that she heard a pop and it has been very painful.  She has not been able to walk without the boot.  Some mild increase in swelling.  No significant bruising.  No other treatment.  No other injuries.  Prior to the injury her foot was feeling great.  No other concerns. Denies any systemic complaints such as fevers, chills, nausea, vomiting. No acute changes since last appointment, and no other complaints at this time.   Objective: AAO x3, NAD DP/PT pulses palpable bilaterally, CRT less than 3 seconds There is tenderness palpation mostly along the fifth digit along the MPJ on the third metatarsal base along the course the peroneal tendon.  Overall condition intact.  Is difficult to fully evaluate the strength but significant she is guarding due to discomfort.  Achilles tendon is intact.  Flexor, extensor tendons intact otherwise. No pain with calf compression, swelling, warmth, erythema  Assessment: Fifth toe fracture, peroneal injury  Plan: -All treatment options discussed with the patient including all alternatives, risks, complications.  -X-ray obtained reviewed.  Hardware intact from prior surgery.  Fracture noted the fifth toe on the proximal phalanx base. -At this point I understand the cam boot.  Prescribed Medrol Dosepak.  Ice elevation.  We will see her back next 1 to 2 weeks and she continues have symptoms on the peroneal tendon will order an MRI. -Patient encouraged to call the office with any questions, concerns, change in symptoms.   Trula Slade DPM

## 2020-11-13 ENCOUNTER — Other Ambulatory Visit: Payer: Self-pay | Admitting: Physician Assistant

## 2020-11-13 DIAGNOSIS — B001 Herpesviral vesicular dermatitis: Secondary | ICD-10-CM

## 2020-12-12 DIAGNOSIS — Z309 Encounter for contraceptive management, unspecified: Secondary | ICD-10-CM | POA: Diagnosis not present

## 2021-01-02 ENCOUNTER — Encounter: Payer: Self-pay | Admitting: Physician Assistant

## 2021-01-03 ENCOUNTER — Other Ambulatory Visit: Payer: Self-pay

## 2021-01-03 DIAGNOSIS — E669 Obesity, unspecified: Secondary | ICD-10-CM

## 2021-01-03 MED ORDER — IMCIVREE 10 MG/ML ~~LOC~~ SOLN: SUBCUTANEOUS | 1 refills | Status: AC

## 2021-01-06 ENCOUNTER — Telehealth: Payer: Self-pay | Admitting: Physician Assistant

## 2021-01-06 NOTE — Telephone Encounter (Signed)
[facility] called in stating that the Setmelanotide Acetate 10 mg was to go to their pharmacy. Phone # [phone] Fax# [phone].   She did say that this drug does require Genetic testing and wanted to know if that is something that the pt can have done here?   Please advise

## 2021-01-07 ENCOUNTER — Telehealth: Payer: Self-pay

## 2021-01-07 ENCOUNTER — Other Ambulatory Visit: Payer: Self-pay

## 2021-01-07 DIAGNOSIS — E669 Obesity, unspecified: Secondary | ICD-10-CM

## 2021-01-07 NOTE — Progress Notes (Signed)
Referral placed for Medical Weight Management  Dx: Obesity

## 2021-01-07 NOTE — Telephone Encounter (Signed)
Referral has been placed and patient notified.

## 2021-01-07 NOTE — Telephone Encounter (Signed)
Called and left a vm to inform patient that her referral has been placed to Medical Weight Management for obesity.

## 2021-01-07 NOTE — Telephone Encounter (Signed)
I am not familiar with this medication nor the genetic testing required to prescribe it (approved for genetic obesity).  If she would like to pursue this line of weight loss treatment, I need to refer her to Medical Weight Management

## 2021-01-07 NOTE — Telephone Encounter (Signed)
Called to inform patient that per provider the alternate med that he insurance wants to use is not something that she is familiar with as well as the genetic testing. But will place a referral to wight loss management.

## 2021-01-07 NOTE — Telephone Encounter (Signed)
Ok to refer to Medical Weight Management- dx obesity

## 2021-01-17 ENCOUNTER — Telehealth: Payer: Self-pay

## 2021-01-17 NOTE — Telephone Encounter (Signed)
Called and spoke with a representative name Princess Q at [facility] and she stated that patient is no longer eligible with this insurance. A PA was in the process of being done for IMCIVREE 10MG/ML  and it stated that patient was not eligible which prompted the call to insurance company to verify. Patient has been uninsured with [employer] since 11/29/20 Reference number to the call is [id number]. I called patient to verify new insurance coverage but no answer. Left vm to return call to office with new insurance information.

## 2021-03-22 ENCOUNTER — Encounter (INDEPENDENT_AMBULATORY_CARE_PROVIDER_SITE_OTHER): Payer: Self-pay

## 2021-10-20 ENCOUNTER — Encounter: Payer: Self-pay | Admitting: Family Medicine

## 2021-10-20 ENCOUNTER — Ambulatory Visit (INDEPENDENT_AMBULATORY_CARE_PROVIDER_SITE_OTHER): Payer: BC Managed Care – PPO | Admitting: Family Medicine

## 2021-10-20 VITALS — BP 128/74 | HR 78 | Temp 98.1°F | Resp 16 | Ht 66.0 in | Wt 234.6 lb

## 2021-10-20 DIAGNOSIS — H938X3 Other specified disorders of ear, bilateral: Secondary | ICD-10-CM

## 2021-10-20 DIAGNOSIS — R42 Dizziness and giddiness: Secondary | ICD-10-CM | POA: Diagnosis not present

## 2021-10-20 DIAGNOSIS — R0981 Nasal congestion: Secondary | ICD-10-CM | POA: Diagnosis not present

## 2021-10-20 LAB — POC INFLUENZA A&B (BINAX/QUICKVUE)
Influenza A, POC: NEGATIVE
Influenza B, POC: NEGATIVE

## 2021-10-20 MED ORDER — MECLIZINE HCL 25 MG PO TABS
25.0000 mg | ORAL_TABLET | Freq: Three times a day (TID) | ORAL | 0 refills | Status: AC | PRN
Start: 2021-10-20 — End: ?

## 2021-10-20 MED ORDER — METHYLPREDNISOLONE 4 MG PO TBPK: ORAL_TABLET | ORAL | 0 refills | Status: AC

## 2021-10-20 NOTE — Progress Notes (Signed)
Subjective:  Patient ID: Lisa Hendrix, female    DOB: Nov 30, 1976  Age: 45 y.o. MRN: [id number]  CC:  Chief Complaint  Patient presents with   Ear Fullness    Pt reports full sinus, recently popped her ears and noted it made her dizzy, blowing her nose it causes dizzy spell, started Saturday     HPI Lisa Hendrix presents for   Dizziness after popping ears Has recently had some sinus congestion - started 2 days ago.  Cold symptoms. Pressure in both ears, congestion feeling. Muffled. Some noise R ear, water sound, not pulsatile. Some soreness below left ear, some soreness in left ear when pops. When popped ears 2 days ago - felt dizzy, room spinning, 1 episode of emesis. Dizziness resolved in 1 hour. Better yesterday, but still feels like in a tunnel - ears congested.  Blew nose at home today, return of room spinning. 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 Spinning resolved after 11mn. Mother and grandmother have vertigo.  No new chronic meds.  Home covid test negative yesterday.  No flu vaccine (FH of [facility] - has been advised not to take).   Tx: advil.  Otc flonase intermittently -when congested only. Restarted 2 days ago. No recent Afrin or in past.   No fever. No HA/bodyache.  Son sick 2 weeks ago - cold. He had negative flu and covid testing.  No focal weakness, slurred speech.  No recent mountain travel, airline travel or swimming.     History Patient Active Problem List   Diagnosis Date Noted   Bunion 08/26/2020   Thyroid condition 06/25/2017   Chronic migraine 08/14/2016   TMJ arthralgia 12/02/2014   Depression with anxiety 10/05/2014   Obesity (BMI 30-39.9) 10/05/2014   General medical examination 07/20/2011   FATIGUE 10/29/2010   Past Medical History:  Diagnosis Date   Depression    denies   Family history of adverse reaction to anesthesia    Mother- difficulty awaken- 2016- 1st time ever   Headache    MIgraines   History of  kidney stones 2000   passed   Pleurisy    Past Surgical History:  Procedure Laterality Date   CHOLECYSTECTOMY N/A 10/05/2016   Procedure: LAPAROSCOPIC CHOLECYSTECTOMY;  Surgeon: LArta BruceKinsinger, MD;  Location: [facility];  Service: General;  Laterality: N/A;   KNEE ARTHROSCOPY Left 2014   MCL repair   TUMOR REMOVAL  01/2008   spine   WISDOM TOOTH EXTRACTION  1995   Allergies  Allergen Reactions   Penicillins Swelling    SWELLING REACTION UNSPECIFIED   Has patient had a PCN reaction causing immediate rash, facial/tongue/throat swelling, SOB or lightheadedness with hypotension: Yes Has patient had a PCN reaction causing severe rash involving mucus membranes or skin necrosis: No Has patient had a PCN reaction that required hospitalization Unknown Has patient had a PCN reaction occurring within the last 10 years: No If all of the above answers are "NO", then may proceed with Cephalosporin use.    Prior to Admission medications   Medication Sig Start Date End Date Taking? Authorizing Provider  acyclovir (ZOVIRAX) 200 MG capsule Take 1 capsule (200 mg total) by mouth as needed. 10/15/20  Yes MBrunetta Jeans PA-C  clindamycin (CLEOCIN) 300 MG capsule Take 1 capsule (300 mg total) by mouth 3 (three) times daily. 08/07/20  Yes WTrula Slade DPM  ibuprofen (ADVIL) 800 MG tablet Take 1 tablet (800 mg total) by mouth every 8 (  eight) hours as needed. 08/08/20  Yes Trula Slade, DPM  levonorgestrel (MIRENA) 20 MCG/24HR IUD 1 each by Intrauterine route once.   Yes [provider]  levothyroxine (SYNTHROID, LEVOTHROID) 25 MCG tablet Take 1 tablet (25 mcg total) by mouth daily before breakfast. Last refill. Please contact office to schedule appointment 06/13/18  Yes Brunetta Jeans, PA-C  methylPREDNISolone (MEDROL DOSEPAK) 4 MG TBPK tablet Take as directed 10/28/20  Yes Trula Slade, DPM  ondansetron (ZOFRAN) 4 MG tablet Take 1 tablet (4 mg total) by mouth every 8 (eight)  hours as needed for nausea or vomiting. 08/07/20  Yes Trula Slade, DPM  Setmelanotide Acetate (IMCIVREE) 10 MG/ML SOLN Inject 0.6 mg SQ daily x 1 week. Then increase to 1.2 mg daily x 1 week. Then increase to 1.8 mg daily. 01/03/21  Yes Midge Minium, MD  SUMAtriptan (IMITREX) 25 MG tablet TAKE 1 TABLET BY MOUTH ONCE. MAY REPEAT IN 2 HOURS IF HEADACHE PERSISTS 07/26/17  Yes Brunetta Jeans, PA-C  terbinafine (LAMISIL) 250 MG tablet Take 1 tablet (250 mg total) by mouth daily. 10/10/20  Yes Trula Slade, DPM  topiramate (TOPAMAX) 25 MG tablet TAKE 1 TABLET BY MOUTH TWICE A DAY 02/28/18  Yes Brunetta Jeans, PA-C  triamcinolone (NASACORT AQ) 55 MCG/ACT AERO nasal inhaler Place 2 sprays into the nose daily as needed. Patient taking differently: Place 2 sprays into the nose daily as needed (allergies). 04/26/15  Yes Midge Minium, MD  valACYclovir (VALTREX) 1000 MG tablet TAKE TWO TABLETS TWICE DAILY FOR 1 DAY 01/11/19  Yes Brunetta Jeans, PA-C   Social History   Socioeconomic History   Marital status: Married    Spouse name: Not on file   Number of children: Not on file   Years of education: Not on file   Highest education level: Not on file  Occupational History   Not on file  Tobacco Use   Smoking status: Never   Smokeless tobacco: Never  Substance and Sexual Activity   Alcohol use: No    Comment: socially   Drug use: No   Sexual activity: Not on file  Other Topics Concern   Not on file  Social History Narrative   Not on file   Social Determinants of Health   Financial Resource Strain: Not on file  Food Insecurity: Not on file  Transportation Needs: Not on file  Physical Activity: Not on file  Stress: Not on file  Social Connections: Not on file  Intimate Partner Violence: Not on file    Review of Systems Per HPI  Objective:   Vitals:   10/20/21 0940  BP: 128/74  Pulse: 78  Resp: 16  Temp: 98.1 F (36.7 C)  TempSrc: Temporal  SpO2: 96%   Weight: 234 lb 9.6 oz (106.4 kg)  Height: 5' 6"  (1.676 m)     Physical Exam Vitals reviewed.  Constitutional:      Appearance: Normal appearance. She is well-developed.  HENT:     Head: Normocephalic and atraumatic.     Ears:     Comments: Minimal clear fluid behind right greater than left TMs.  Canals clear.  TM's without erythema or apparent retraction.  No rupture.  Gross hearing intact and equal with finger rub.  Slight tenderness anterior to the left ear at TMJ No lymphadenopathy.    Nose:     Comments: Sinuses nontender to percussion. Eyes:     Conjunctiva/sclera: Conjunctivae normal.  Pupils: Pupils are equal, round, and reactive to light.  Neck:     Vascular: No carotid bruit.  Cardiovascular:     Rate and Rhythm: Normal rate and regular rhythm.     Heart sounds: Normal heart sounds.  Pulmonary:     Effort: Pulmonary effort is normal.     Breath sounds: Normal breath sounds.  Abdominal:     Palpations: Abdomen is soft. There is no pulsatile mass.     Tenderness: There is no abdominal tenderness.  Musculoskeletal:     Right lower leg: No edema.     Left lower leg: No edema.  Skin:    General: Skin is warm and dry.  Neurological:     Mental Status: She is alert and oriented to person, place, and time.  Psychiatric:        Mood and Affect: Mood normal.        Behavior: Behavior normal.      Results for orders placed or performed in visit on 10/20/21  POC Influenza A&B(BINAX/QUICKVUE)  Result Value Ref Range   Influenza A, POC Negative Negative   Influenza B, POC Negative Negative    Assessment & Plan:  Lisa Hendrix is a 45 y.o. female . Sinus congestion - Plan: POC Influenza A&B(BINAX/QUICKVUE), methylPREDNISolone (MEDROL DOSEPAK) 4 MG TBPK tablet  Vertigo - Plan: meclizine (ANTIVERT) 25 MG tablet, methylPREDNISolone (MEDROL DOSEPAK) 4 MG TBPK tablet  Ear pressure, bilateral - Plan: methylPREDNISolone (MEDROL DOSEPAK) 4 MG TBPK  tablet  May have component eustachian tube dysfunction with sinus congestion, but with vertiginous symptoms differential includes vestibular neuronitis.  Has not had persistent symptoms.  We will initially treat with antihistamine, meclizine, trial of Afrin temporarily for nasal congestion with option of starting Medrol Dosepak in the next 48 hours if not improving or if persistent/worsening vertiginous symptoms.  Urgent care/ER/RTC precautions discussed.  Meds ordered this encounter  Medications   meclizine (ANTIVERT) 25 MG tablet    Sig: Take 1 tablet (25 mg total) by mouth 3 (three) times daily as needed for dizziness.    Dispense:  30 tablet    Refill:  0   methylPREDNISolone (MEDROL DOSEPAK) 4 MG TBPK tablet    Sig: Use as directed - dosepak.    Dispense:  21 tablet    Refill:  0   Patient Instructions  Sinus/nasal congestion is likely causing some of the pressure/congestion in the ears.  I do see a small amount behind the eardrums, but no sign of bacterial infection at this time. Labyrinthitis (vestibular neuronitis) can also cause these symptoms.  Start with meclizine today. Ok to continue flonase nasal spray, and Afrin over the counter for nasal congestion as needed. (do not exceed 3 days use).  If not improved in next 48 hours, start prednisone taper. Let me know if there are questions and get better soon.  Return to the clinic or go to the nearest emergency room if any of your symptoms worsen or new symptoms occur.  Vertigo Vertigo is the feeling that you or your surroundings are moving when they are not. This feeling can come and go at any time. Vertigo often goes away on its own. Vertigo can be dangerous if it occurs while you are doing something that could endanger yourself or others, such as driving or operating machinery. Your health care provider will do tests to try to determine the cause of your vertigo. Tests will also help your health care provider decide how best to treat  your condition. Follow these instructions at home: Eating and drinking   Dehydration can make vertigo worse. Drink enough fluid to keep your urine pale yellow. Do not drink alcohol. Activity Return to your normal activities as told by your health care provider. Ask your health care provider what activities are safe for you. In the morning, first sit up on the side of the bed. When you feel okay, stand slowly while you hold onto something until you know that your balance is fine. Move slowly. Avoid sudden body or head movements or certain positions, as told by your health care provider. If you have trouble walking or keeping your balance, try using a cane for stability. If you feel dizzy or unstable, sit down right away. Avoid doing any tasks that would cause danger to you or others if vertigo occurs. Avoid bending down if you feel dizzy. Place items in your home so that they are easy for you to reach without bending or leaning over. Do not drive or use machinery if you feel dizzy. General instructions Take over-the-counter and prescription medicines only as told by your health care provider. Keep all follow-up visits. This is important. Contact a health care provider if: Your medicines do not relieve your vertigo or they make it worse. Your condition gets worse or you develop new symptoms. You have a fever. You develop nausea or vomiting, or if nausea gets worse. Your family or friends notice any behavioral changes. You have numbness or a prickling and tingling sensation in part of your body. Get help right away if you: Are always dizzy or you faint. Develop severe headaches. Develop a stiff neck. Develop sensitivity to light. Have difficulty moving or speaking. Have weakness in your hands, arms, or legs. Have changes in your hearing or vision. These symptoms may represent a serious problem that is an emergency. Do not wait to see if the symptoms will go away. Get medical help right  away. Call your local emergency services (911 in the [place] Do not drive yourself to the hospital. Summary Vertigo is the feeling that you or your surroundings are moving when they are not. Your health care provider will do tests to try to determine the cause of your vertigo. Follow instructions for home care. You may be told to avoid certain tasks, positions, or movements. Contact a health care provider if your medicines do not relieve your symptoms, or if you have a fever, nausea, vomiting, or changes in behavior. Get help right away if you have severe headaches or difficulty speaking, or you develop hearing or vision problems. This information is not intended to replace advice given to you by your health care provider. Make sure you discuss any questions you have with your health care provider. Document Revised: 10/16/2020 Document Reviewed: 10/16/2020 Elsevier Patient Education  2022 [employer],   Merri Ray, MD [facility] 10/20/21 6:51 PM

## 2021-10-20 NOTE — Patient Instructions (Addendum)
Sinus/nasal congestion is likely causing some of the pressure/congestion in the ears.  I do see a small amount behind the eardrums, but no sign of bacterial infection at this time. Labyrinthitis (vestibular neuronitis) can also cause these symptoms.  Start with meclizine today. Ok to continue flonase nasal spray, and Afrin over the counter for nasal congestion as needed. (do not exceed 3 days use).  If not improved in next 48 hours, start prednisone taper. Let me know if there are questions and get better soon.  Return to the clinic or go to the nearest emergency room if any of your symptoms worsen or new symptoms occur.  Vertigo Vertigo is the feeling that you or your surroundings are moving when they are not. This feeling can come and go at any time. Vertigo often goes away on its own. Vertigo can be dangerous if it occurs while you are doing something that could endanger yourself or others, such as driving or operating machinery. Your health care provider will do tests to try to determine the cause of your vertigo. Tests will also help your health care provider decide how best to treat your condition. Follow these instructions at home: Eating and drinking   Dehydration can make vertigo worse. Drink enough fluid to keep your urine pale yellow. Do not drink alcohol. Activity Return to your normal activities as told by your health care provider. Ask your health care provider what activities are safe for you. In the morning, first sit up on the side of the bed. When you feel okay, stand slowly while you hold onto something until you know that your balance is fine. Move slowly. Avoid sudden body or head movements or certain positions, as told by your health care provider. If you have trouble walking or keeping your balance, try using a cane for stability. If you feel dizzy or unstable, sit down right away. Avoid doing any tasks that would cause danger to you or others if vertigo occurs. Avoid  bending down if you feel dizzy. Place items in your home so that they are easy for you to reach without bending or leaning over. Do not drive or use machinery if you feel dizzy. General instructions Take over-the-counter and prescription medicines only as told by your health care provider. Keep all follow-up visits. This is important. Contact a health care provider if: Your medicines do not relieve your vertigo or they make it worse. Your condition gets worse or you develop new symptoms. You have a fever. You develop nausea or vomiting, or if nausea gets worse. Your family or friends notice any behavioral changes. You have numbness or a prickling and tingling sensation in part of your body. Get help right away if you: Are always dizzy or you faint. Develop severe headaches. Develop a stiff neck. Develop sensitivity to light. Have difficulty moving or speaking. Have weakness in your hands, arms, or legs. Have changes in your hearing or vision. These symptoms may represent a serious problem that is an emergency. Do not wait to see if the symptoms will go away. Get medical help right away. Call your local emergency services (911 in the [place] Do not drive yourself to the hospital. Summary Vertigo is the feeling that you or your surroundings are moving when they are not. Your health care provider will do tests to try to determine the cause of your vertigo. Follow instructions for home care. You may be told to avoid certain tasks, positions, or movements. Contact a health care provider  if your medicines do not relieve your symptoms, or if you have a fever, nausea, vomiting, or changes in behavior. Get help right away if you have severe headaches or difficulty speaking, or you develop hearing or vision problems. This information is not intended to replace advice given to you by your health care provider. Make sure you discuss any questions you have with your health care provider. Document  Revised: 10/16/2020 Document Reviewed: 10/16/2020 Elsevier Patient Education  2022 [employer].

## 2022-02-24 LAB — COLOGUARD: COLOGUARD: NEGATIVE

## 2022-02-24 LAB — EXTERNAL GENERIC LAB PROCEDURE: COLOGUARD: NEGATIVE

## 2022-07-08 ENCOUNTER — Encounter (INDEPENDENT_AMBULATORY_CARE_PROVIDER_SITE_OTHER): Payer: Self-pay
